# Patient Record
Sex: Male | Born: 2000
Health system: Southern US, Community
[De-identification: ages and names within clinical notes are randomized; demographics above are authoritative.]

## PROBLEM LIST (undated history)

## (undated) DIAGNOSIS — R51 Headache: Secondary | ICD-10-CM

## (undated) DIAGNOSIS — R519 Headache, unspecified: Secondary | ICD-10-CM

## (undated) DIAGNOSIS — K219 Gastro-esophageal reflux disease without esophagitis: Secondary | ICD-10-CM

## (undated) DIAGNOSIS — R569 Unspecified convulsions: Secondary | ICD-10-CM

## (undated) DIAGNOSIS — F419 Anxiety disorder, unspecified: Secondary | ICD-10-CM

## (undated) DIAGNOSIS — Z21 Asymptomatic human immunodeficiency virus [HIV] infection status: Secondary | ICD-10-CM

## (undated) DIAGNOSIS — R55 Syncope and collapse: Secondary | ICD-10-CM

## (undated) DIAGNOSIS — B20 Human immunodeficiency virus [HIV] disease: Secondary | ICD-10-CM

## (undated) HISTORY — PX: TONSILLECTOMY: SUR1361

## (undated) HISTORY — DX: Headache: R51

## (undated) HISTORY — DX: Headache, unspecified: R51.9

## (undated) HISTORY — DX: Syncope and collapse: R55

## (undated) HISTORY — DX: Gastro-esophageal reflux disease without esophagitis: K21.9

## (undated) HISTORY — DX: Unspecified convulsions: R56.9

---
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
## 2020-01-22 ENCOUNTER — Emergency Department (HOSPITAL_BASED_OUTPATIENT_CLINIC_OR_DEPARTMENT_OTHER)
Admission: EM | Admit: 2020-01-22 | Discharge: 2020-01-22 | Disposition: A | Discharge status: Home / Self Care | Payer: BLUE CROSS/BLUE SHIELD | Attending: Emergency Medicine | Admitting: Emergency Medicine

## 2020-01-22 ENCOUNTER — Other Ambulatory Visit: Payer: Self-pay

## 2020-01-22 ENCOUNTER — Encounter (HOSPITAL_BASED_OUTPATIENT_CLINIC_OR_DEPARTMENT_OTHER): Payer: Self-pay | Admitting: Emergency Medicine

## 2020-01-22 DIAGNOSIS — R0602 Shortness of breath: Secondary | ICD-10-CM | POA: Diagnosis not present

## 2020-01-22 DIAGNOSIS — Z5321 Procedure and treatment not carried out due to patient leaving prior to being seen by health care provider: Secondary | ICD-10-CM | POA: Insufficient documentation

## 2020-01-22 DIAGNOSIS — R05 Cough: Secondary | ICD-10-CM | POA: Insufficient documentation

## 2020-01-22 NOTE — ED Triage Notes (Signed)
Pt reports shortness of breath, loss of taste/smell and cough x3-4 days. States sick contacts at work last week.

## 2020-01-22 NOTE — ED Notes (Signed)
Pt did not answer when name called by 3

## 2020-01-23 DIAGNOSIS — B20 Human immunodeficiency virus [HIV] disease: Secondary | ICD-10-CM | POA: Insufficient documentation

## 2020-11-13 ENCOUNTER — Emergency Department (HOSPITAL_COMMUNITY)
Admission: EM | Admit: 2020-11-13 | Discharge: 2020-11-13 | Disposition: A | Discharge status: Home / Self Care | Payer: BLUE CROSS/BLUE SHIELD | Attending: Emergency Medicine | Admitting: Emergency Medicine

## 2020-11-13 ENCOUNTER — Encounter (HOSPITAL_COMMUNITY): Payer: Self-pay | Admitting: Emergency Medicine

## 2020-11-13 DIAGNOSIS — S60812A Abrasion of left wrist, initial encounter: Secondary | ICD-10-CM | POA: Diagnosis not present

## 2020-11-13 DIAGNOSIS — Z5321 Procedure and treatment not carried out due to patient leaving prior to being seen by health care provider: Secondary | ICD-10-CM | POA: Insufficient documentation

## 2020-11-13 DIAGNOSIS — Y92410 Unspecified street and highway as the place of occurrence of the external cause: Secondary | ICD-10-CM | POA: Insufficient documentation

## 2020-11-13 DIAGNOSIS — S80812A Abrasion, left lower leg, initial encounter: Secondary | ICD-10-CM | POA: Insufficient documentation
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
## 2020-11-14 ENCOUNTER — Encounter (HOSPITAL_BASED_OUTPATIENT_CLINIC_OR_DEPARTMENT_OTHER): Payer: Self-pay

## 2020-11-14 ENCOUNTER — Other Ambulatory Visit: Payer: Self-pay

## 2020-11-14 ENCOUNTER — Emergency Department (HOSPITAL_BASED_OUTPATIENT_CLINIC_OR_DEPARTMENT_OTHER): Payer: BLUE CROSS/BLUE SHIELD

## 2020-11-14 ENCOUNTER — Other Ambulatory Visit (HOSPITAL_COMMUNITY): Payer: Self-pay | Admitting: Emergency Medicine

## 2020-11-14 ENCOUNTER — Emergency Department (HOSPITAL_BASED_OUTPATIENT_CLINIC_OR_DEPARTMENT_OTHER)
Admission: EM | Admit: 2020-11-14 | Discharge: 2020-11-14 | Disposition: A | Discharge status: Home / Self Care | Payer: BLUE CROSS/BLUE SHIELD | Attending: Emergency Medicine | Admitting: Emergency Medicine

## 2020-11-14 DIAGNOSIS — S6992XA Unspecified injury of left wrist, hand and finger(s), initial encounter: Secondary | ICD-10-CM

## 2020-11-14 DIAGNOSIS — S80812A Abrasion, left lower leg, initial encounter: Secondary | ICD-10-CM | POA: Insufficient documentation

## 2020-11-14 DIAGNOSIS — Z21 Asymptomatic human immunodeficiency virus [HIV] infection status: Secondary | ICD-10-CM | POA: Diagnosis not present

## 2020-11-14 DIAGNOSIS — F17200 Nicotine dependence, unspecified, uncomplicated: Secondary | ICD-10-CM | POA: Diagnosis not present

## 2020-11-14 DIAGNOSIS — M545 Low back pain, unspecified: Secondary | ICD-10-CM | POA: Insufficient documentation

## 2020-11-14 DIAGNOSIS — R208 Other disturbances of skin sensation: Secondary | ICD-10-CM | POA: Insufficient documentation

## 2020-11-14 DIAGNOSIS — S60812A Abrasion of left wrist, initial encounter: Secondary | ICD-10-CM | POA: Diagnosis not present

## 2020-11-14 DIAGNOSIS — Y92410 Unspecified street and highway as the place of occurrence of the external cause: Secondary | ICD-10-CM | POA: Diagnosis not present

## 2020-11-14 DIAGNOSIS — Z79899 Other long term (current) drug therapy: Secondary | ICD-10-CM | POA: Insufficient documentation

## 2020-11-14 DIAGNOSIS — S8992XA Unspecified injury of left lower leg, initial encounter: Secondary | ICD-10-CM

## 2020-11-14 HISTORY — DX: Human immunodeficiency virus (HIV) disease: B20

## 2020-11-14 HISTORY — DX: Asymptomatic human immunodeficiency virus (hiv) infection status: Z21

## 2020-11-14 MED ORDER — CYCLOBENZAPRINE HCL 10 MG PO TABS: 10.0000 mg | ORAL_TABLET | Freq: Two times a day (BID) | ORAL | 0 refills | Status: DC | PRN

## 2020-11-14 MED ORDER — IBUPROFEN 400 MG PO TABS
600.0000 mg | ORAL_TABLET | Freq: Once | ORAL | Status: AC
  Administered 2020-11-14: 600 mg via ORAL
  Filled 2020-11-14: qty 1

## 2020-11-14 MED FILL — CYCLOBENZAPRINE HCL 10 MG T: 10 | 7 days supply | Qty: 14 | Fill #0
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
## 2020-11-14 NOTE — Discharge Instructions (Addendum)
Please read instructions below. Apply ice to your areas of pain for 20 minutes at a time. You can take tylenol every 6 hours as needed for pain. You can take flexeril every 12 hours as needed for muscle spasm. Be aware this medication can make you drowsy. Schedule an appointment with the sports medicine specialist if pain persists for repeat xray of your wrist in 1-2 weeks.  Return to the ER for severely worsening headache, vision changes, if new numbness or weakness in your arms or legs, inability to urinate, inability to hold your bowels, or concerning symptoms.

## 2021-03-05 ENCOUNTER — Emergency Department (HOSPITAL_BASED_OUTPATIENT_CLINIC_OR_DEPARTMENT_OTHER)
Admission: EM | Admit: 2021-03-05 | Discharge: 2021-03-06 | Disposition: A | Discharge status: Home / Self Care | Payer: BLUE CROSS/BLUE SHIELD | Attending: Emergency Medicine | Admitting: Emergency Medicine

## 2021-03-05 ENCOUNTER — Encounter (HOSPITAL_BASED_OUTPATIENT_CLINIC_OR_DEPARTMENT_OTHER): Payer: Self-pay | Admitting: *Deleted

## 2021-03-05 ENCOUNTER — Other Ambulatory Visit: Payer: Self-pay

## 2021-03-05 DIAGNOSIS — Z202 Contact with and (suspected) exposure to infections with a predominantly sexual mode of transmission: Secondary | ICD-10-CM | POA: Insufficient documentation

## 2021-03-05 DIAGNOSIS — T7421XA Adult sexual abuse, confirmed, initial encounter: Secondary | ICD-10-CM | POA: Insufficient documentation

## 2021-03-05 DIAGNOSIS — Z87891 Personal history of nicotine dependence: Secondary | ICD-10-CM | POA: Insufficient documentation
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
## 2021-03-06 DIAGNOSIS — T7421XA Adult sexual abuse, confirmed, initial encounter: Secondary | ICD-10-CM | POA: Diagnosis not present

## 2021-03-06 MED ORDER — LIDOCAINE HCL (PF) 1 % IJ SOLN
INTRAMUSCULAR | Status: AC
  Filled 2021-03-06: qty 5

## 2021-03-06 MED ORDER — DOXYCYCLINE HYCLATE 100 MG PO TABS
100.0000 mg | ORAL_TABLET | Freq: Once | ORAL | Status: AC
  Administered 2021-03-06: 100 mg via ORAL
  Filled 2021-03-06: qty 1

## 2021-03-06 MED ORDER — DOXYCYCLINE HYCLATE 100 MG PO CAPS: 100.0000 mg | ORAL_CAPSULE | Freq: Two times a day (BID) | ORAL | 0 refills | Status: DC

## 2021-03-06 MED ORDER — CEFTRIAXONE SODIUM 500 MG IJ SOLR
500.0000 mg | Freq: Once | INTRAMUSCULAR | Status: AC
  Administered 2021-03-06: 500 mg via INTRAMUSCULAR
  Filled 2021-03-06: qty 500

## 2021-03-06 NOTE — ED Notes (Signed)
Chaperone with MD for rectal exam and GC collection , Pt refused/ decided against Sane exam , v/u his options and  d/c instructions. Pt chose for TX of STD instead of waiting for results.
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
## 2021-03-07 LAB — GC/CHLAMYDIA PROBE AMP (~~LOC~~) NOT AT ARMC
Chlamydia: NEGATIVE
Comment: NEGATIVE
Comment: NORMAL
Neisseria Gonorrhea: POSITIVE — AB
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
## 2021-10-01 ENCOUNTER — Encounter (HOSPITAL_BASED_OUTPATIENT_CLINIC_OR_DEPARTMENT_OTHER): Payer: Self-pay | Admitting: Emergency Medicine

## 2021-10-01 ENCOUNTER — Emergency Department (HOSPITAL_BASED_OUTPATIENT_CLINIC_OR_DEPARTMENT_OTHER)
Admission: EM | Admit: 2021-10-01 | Discharge: 2021-10-01 | Disposition: A | Discharge status: Home / Self Care | Payer: BLUE CROSS/BLUE SHIELD | Attending: Emergency Medicine | Admitting: Emergency Medicine

## 2021-10-01 ENCOUNTER — Other Ambulatory Visit: Payer: Self-pay

## 2021-10-01 DIAGNOSIS — K6289 Other specified diseases of anus and rectum: Secondary | ICD-10-CM | POA: Diagnosis present

## 2021-10-01 MED ORDER — LIDOCAINE 5 % EX OINT: 1.0000 "application " | TOPICAL_OINTMENT | Freq: Four times a day (QID) | CUTANEOUS | 0 refills | Status: DC | PRN

## 2021-10-01 MED ORDER — LIDOCAINE HCL URETHRAL/MUCOSAL 2 % EX GEL
1.0000 "application " | Freq: Once | CUTANEOUS | Status: AC
  Administered 2021-10-01: 1 via TOPICAL

## 2021-10-01 MED ORDER — AMOXICILLIN-POT CLAVULANATE 875-125 MG PO TABS: 1.0000 | ORAL_TABLET | Freq: Two times a day (BID) | ORAL | 0 refills | Status: DC

## 2021-10-01 NOTE — ED Notes (Signed)
Discharge instructions discussed with pt. Pt verbalized understanding. Pt stable and ambulatory.  °

## 2021-10-01 NOTE — ED Triage Notes (Signed)
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 Now has "Knot" on anus attempted home meds and remedies with no relief.

## 2021-10-01 NOTE — ED Provider Notes (Signed)
[facility] EMERGENCY DEPARTMENT Provider Note   CSN: [id number] Arrival date & time: 10/01/21  0229     History  Chief Complaint  Patient presents with   Rectal Pain    Steven [name] is a 21 y.o. male.  The history is provided by the patient.  Steven [name] is a 21 y.o. male who presents to the Emergency Department complaining of rectal pain.  He presents emergency department complaining of rectal pain that started about 2 days ago.  He states that he was moving some heavy boxes for work when he started to develop some mild rectal discomfort.  Symptoms worsened yesterday when he had a bowel movement.  He describes pain just after he has a bowel movement.  No rectal discharge.  No constipation.  No fevers but he does feel hot today.  No nausea, vomiting.  No prior similar symptoms.  He feels like he is swollen in that area.  He does have a history of HIV that is well controlled on medications.  He is compliant with his home medications.  He is not receiving rectal intercourse.  No prior similar symptoms.    Home Medications Prior to Admission medications   Medication Sig Start Date End Date Taking? Authorizing Provider  amoxicillin-clavulanate (AUGMENTIN) 875-125 MG tablet Take 1 tablet by mouth every 12 (twelve) hours. 10/01/21  Yes Tilden Fossa, MD  lidocaine (XYLOCAINE) 5 % ointment Apply 1 application topically 4 (four) times daily as needed. 10/01/21  Yes Tilden Fossa, MD  bictegravir-emtricitabine-tenofovir AF (BIKTARVY) 50-200-25 MG TABS tablet Take 1 tablet by mouth daily. 09/09/20   [provider]  doxycycline (VIBRAMYCIN) 100 MG capsule Take 1 capsule (100 mg total) by mouth 2 (two) times daily. One po bid x 7 days 03/06/21   Molpus, Jonny Ruiz, MD      Allergies    Patient has no known allergies.    Review of Systems   Review of Systems  All other systems reviewed and are negative.  Physical Exam Updated Vital Signs BP 107/63 (BP Location: Left Arm)     Pulse 98    Resp (!) 22    Ht 5\' 11"  (1.803 m)    Wt 99.8 kg    SpO2 100%    BMI 30.68 kg/m  Physical Exam Vitals and nursing note reviewed.  Constitutional:      Appearance: He is well-developed.  HENT:     Head: Normocephalic and atraumatic.  Cardiovascular:     Rate and Rhythm: Normal rate and regular rhythm.  Pulmonary:     Effort: Pulmonary effort is normal. No respiratory distress.  Abdominal:     Palpations: Abdomen is soft.     Tenderness: There is no abdominal tenderness. There is no guarding or rebound.  Genitourinary:    Comments: There is a very mild swelling at the 12 o'clock position with local tenderness to palpation.  There is no significant induration or fluctuance.  There is tenderness on DRE without any palpable masses.  No gross blood on DRE. Musculoskeletal:        General: No tenderness.  Skin:    General: Skin is warm and dry.  Neurological:     Mental Status: He is alert and oriented to person, place, and time.  Psychiatric:        Behavior: Behavior normal.    ED Results / Procedures / Treatments   Labs (all labs ordered are listed, but only abnormal results are displayed) Labs Reviewed - No data  to display  EKG None  Radiology No results found.  Procedures Procedures    Medications Ordered in ED Medications  lidocaine (XYLOCAINE) 2 % jelly 1 application (has no administration in time range)    ED Course/ Medical Decision Making/ A&P                           Medical Decision Making  Patient here for evaluation of rectal pain.  He is nontoxic-appearing on evaluation.  External digital rectal examination without clear hemorrhoid present.  No definite abscess.  Will treat with antibiotics for possible very early infection.  Will treat with Xylocaine for pain control.  Discussed with patient home care for rectal discomfort as well as return precautions.  Current presentation is not consistent with STI/proctitis as patient is not currently  sexually active.        Final Clinical Impression(s) / ED Diagnoses Final diagnoses:  Rectal pain    Rx / DC Orders ED Discharge Orders          Ordered    amoxicillin-clavulanate (AUGMENTIN) 875-125 MG tablet  Every 12 hours        10/01/21 0451    lidocaine (XYLOCAINE) 5 % ointment  4 times daily PRN        10/01/21 0451              Tilden Fossa, MD 10/01/21 [id number]

## 2021-10-27 ENCOUNTER — Other Ambulatory Visit: Payer: Self-pay

## 2021-12-10 ENCOUNTER — Emergency Department (HOSPITAL_BASED_OUTPATIENT_CLINIC_OR_DEPARTMENT_OTHER)
Admission: EM | Admit: 2021-12-10 | Discharge: 2021-12-10 | Disposition: A | Discharge status: Home / Self Care | Payer: BLUE CROSS/BLUE SHIELD | Attending: Emergency Medicine | Admitting: Emergency Medicine

## 2021-12-10 ENCOUNTER — Other Ambulatory Visit: Payer: Self-pay

## 2021-12-10 ENCOUNTER — Encounter (HOSPITAL_BASED_OUTPATIENT_CLINIC_OR_DEPARTMENT_OTHER): Payer: Self-pay | Admitting: Emergency Medicine

## 2021-12-10 DIAGNOSIS — Z202 Contact with and (suspected) exposure to infections with a predominantly sexual mode of transmission: Secondary | ICD-10-CM | POA: Diagnosis not present

## 2021-12-10 DIAGNOSIS — N4889 Other specified disorders of penis: Secondary | ICD-10-CM | POA: Diagnosis present

## 2021-12-10 MED ORDER — CEFTRIAXONE SODIUM 500 MG IJ SOLR
500.0000 mg | Freq: Once | INTRAMUSCULAR | Status: AC
  Administered 2021-12-10: 500 mg via INTRAMUSCULAR
  Filled 2021-12-10: qty 500

## 2021-12-10 MED ORDER — PENICILLIN G BENZATHINE 1200000 UNIT/2ML IM SUSY
2.4000 10*6.[IU] | PREFILLED_SYRINGE | Freq: Once | INTRAMUSCULAR | Status: AC
  Administered 2021-12-10: 2.4 10*6.[IU] via INTRAMUSCULAR
  Filled 2021-12-10: qty 4

## 2021-12-10 MED ORDER — DOXYCYCLINE HYCLATE 100 MG PO CAPS: 100.0000 mg | ORAL_CAPSULE | Freq: Two times a day (BID) | ORAL | 0 refills | Status: AC

## 2021-12-10 NOTE — ED Triage Notes (Signed)
Reports unprotected sexual intercourse 3 days ago . Noticed a "spot " on his penis last night  ?Denies urinary symptoms nor penile discharge  ?

## 2021-12-10 NOTE — Discharge Instructions (Addendum)
It was a pleasure taking care of you today! ? ?You have pending lab results for STI work-up.  You may see the results of your labs on MyChart.  You were treated today for gonorrhea and chlamydia due to your symptoms and exposure.  You were also treated today for syphilis.  Ensure to complete the entire course of antibiotics (doxycycline) prescribed. You may follow up with your primary care provider or Health Department if you are experiencing continued symptoms.  If you have future STI concerns you may follow-up with the regional [facility] [phone]).  This location provides testing and treatment for STI and prep services.  This is free for individuals without insurance.  Appointments are required and typically you can get in on the same day.  Ensure to practice safe sex with condom use.  Ensure to maintain follow-up with your infectious disease specialist.  Return to the Emergency Department if you are experiencing fever, increasing/worsening abdominal pain, or penile pain/swelling. ?

## 2021-12-10 NOTE — ED Provider Notes (Signed)
?[facility] EMERGENCY DEPARTMENT ?Provider Note ? ? ?CSN: [id number] ?Arrival date & time: 12/10/21  1050 ? ?  ? ?History ? ?Chief Complaint  ?Patient presents with  ? SEXUALLY TRANSMITTED DISEASE  ? ? ?Steven [name] is a 21 y.o. male with a past medical history of HIV who presents to the emergency department with concerns for exposure to STD onset 3 days ago.  He notes that he had unprotected sexual intercourse approximately 3 days ago.  He noticed a area to his penis onset last night.  Denies fever, chills, abdominal pain, nausea, vomiting, dysuria, hematuria, penile pain/swelling, testicular pain/swelling. Patient has a history of HIV and his viral load is undetected.   ? ?The history is provided by the patient. No language interpreter was used.  ? ?  ? ?Home Medications ?Prior to Admission medications   ?Medication Sig Start Date End Date Taking? Authorizing Provider  ?doxycycline (VIBRAMYCIN) 100 MG capsule Take 1 capsule (100 mg total) by mouth 2 (two) times daily for 7 days. 12/10/21 12/17/21 Yes Agnieszka Newhouse A, PA-C  ?amoxicillin-clavulanate (AUGMENTIN) 875-125 MG tablet Take 1 tablet by mouth every 12 (twelve) hours. 10/01/21   Tilden Fossa, MD  ?bictegravir-emtricitabine-tenofovir AF (BIKTARVY) 50-200-25 MG TABS tablet Take 1 tablet by mouth daily. 09/09/20   [provider]  ?lidocaine (XYLOCAINE) 5 % ointment Apply 1 application topically 4 (four) times daily as needed. 10/01/21   Tilden Fossa, MD  ?   ? ?Allergies    ?Patient has no known allergies.   ? ?Review of Systems   ?Review of Systems  ?Constitutional:  Negative for chills and fever.  ?Gastrointestinal:  Negative for abdominal pain, nausea and vomiting.  ?Genitourinary:  Positive for genital sores. Negative for dysuria, hematuria, penile discharge, penile pain, penile swelling, scrotal swelling and testicular pain.  ?Skin:  Negative for rash.  ?All other systems reviewed and are negative. ? ?Physical Exam ?Updated Vital  Signs ?BP 127/78 (BP Location: Right Arm)   Pulse 60   Temp 98.4 ?F (36.9 ?C) (Oral)   Resp 18   Ht 5\' 11"  (1.803 m)   Wt 93.9 kg   SpO2 99%   BMI 28.87 kg/m?  ?Physical Exam ?Vitals and nursing note reviewed. Exam conducted with a chaperone present.  ?Constitutional:   ?   General: He is not in acute distress. ?   Appearance: Normal appearance. He is not ill-appearing.  ?HENT:  ?   Head: Normocephalic and atraumatic.  ?   Right Ear: External ear normal.  ?   Left Ear: External ear normal.  ?Eyes:  ?   General: No scleral icterus. ?Cardiovascular:  ?   Rate and Rhythm: Normal rate and regular rhythm.  ?   Pulses: Normal pulses.  ?   Heart sounds: Normal heart sounds.  ?Pulmonary:  ?   Effort: Pulmonary effort is normal.  ?   Breath sounds: Normal breath sounds.  ?Abdominal:  ?   General: Abdomen is flat. Bowel sounds are normal. There is no distension.  ?   Palpations: Abdomen is soft. There is no mass.  ?   Tenderness: There is no abdominal tenderness.  ?   Hernia: There is no hernia in the left inguinal area or right inguinal area.  ?Genitourinary: ?   Penis: Lesions present.   ?   Testes: Normal. Cremasteric reflex is present.     ?   Right: Mass, tenderness or swelling not present. Right testis is descended.     ?  Left: Mass, tenderness or swelling not present. Left testis is descended.  ?   Epididymis:  ?   Right: Normal.  ?   Left: Normal.  ?   Comments: NT chaperone present for exam.  Small area of nontender ulceration noted to the lateral aspect of the penis. ?Musculoskeletal:     ?   General: Normal range of motion.  ?   Cervical back: Normal range of motion and neck supple.  ?Lymphadenopathy:  ?   Lower Body: No right inguinal adenopathy. No left inguinal adenopathy.  ?Skin: ?   General: Skin is warm and dry.  ?   Findings: No rash.  ?Neurological:  ?   Mental Status: He is alert.  ? ? ?ED Results / Procedures / Treatments   ?Labs ?(all labs ordered are listed, but only abnormal results are  displayed) ?Labs Reviewed  ?RPR  ?GC/CHLAMYDIA PROBE AMP ([place]  ? ? ?EKG ?None ? ?Radiology ?No results found. ? ?Procedures ?Procedures  ? ? ?Medications Ordered in ED ?Medications  ?penicillin g benzathine (BICILLIN LA) 1200000 UNIT/2ML injection 2.4 Million Units (2.4 Million Units Intramuscular Given 12/10/21 1258)  ?cefTRIAXone (ROCEPHIN) injection 500 mg (500 mg Intramuscular Given 12/10/21 1259)  ? ? ?ED Course/ Medical Decision Making/ A&P ?Clinical Course as of 12/10/21 1859  ?Wed Dec 10, 2021  ?1249 Discussed with patient at bedside regarding STD prophylaxis treatment in the ED.  Patient agreeable to being treated for gonorrhea, chlamydia, syphilis at this time.  Discussed discharge treatment plan with patient at bedside.  Patient agreeable at this time.  Patient appears safe for discharge. [SB]  ?  ?Clinical Course User Index ?[SB] Lalah Durango A, PA-C  ? ?                        ?Medical Decision Making ?Amount and/or Complexity of Data Reviewed ?Labs: ordered. ? ?Risk ?Prescription drug management. ? ? ?Patient presents to the ED with concerns for possible STD due to unprotected sexual intercourse 3 days ago.  Patient also noted a spot to his penis onset last night.  No urinary symptoms or penile discharge at this time.  Vital signs stable, patient afebrile.  On exam patient without acute cardiovascular, respiratory, abdominal pain findings.  Nurse tech chaperone present for genital exam, small nontender ulceration noted lateral aspect of penis without discharge.  No testicular tenderness to palpation or swelling on exam.  Differential diagnosis includes gonorrhea, chlamydia, syphilis, epididymitis.  ? ?Labs:  ?I ordered, and personally interpreted labs.  The pertinent results include:   ?GC/chlamydia and syphilis ordered and results pending at time of discharge. ? ?Medications:  ?I ordered medication including Rocephin and penicillin for STD prophylaxis ?Reevaluation of the patient  after these medicines and interventions, I reevaluated the patient and found that they have improved ?I have reviewed the patients home medicines and have made adjustments as needed ? ? ?Disposition: ?Patient presentation suspicious for possible exposure to STD.  Due to nontender ulceration on penis, will prophylactically treat patient for syphilis here in the ED.  Patient also treated prophylactically for gonorrhea and chlamydia in the ED.  Results ordered and pending at time of discharge.  After consideration of the diagnostic results and the patients response to treatment, I feel that the patient would benefit from Discharge home.  We will send a prescription for doxycycline due to symptoms and exposure.  Patient advised to inform and treat all sexual partners.  Pt advised on safe sex practices and understands that they have GC/Chlamydia cultures pending and will result in 2-3 days. Pt encouraged to follow up at local health department for future STI checks. Supportive care measures and strict return precautions discussed with patient at bedside. Pt acknowledges and verbalizes understanding. Pt appears safe for discharge. Follow up as indicated in discharge paperwork.  ? ? ?This chart was dictated using voice recognition software, Dragon. Despite the best efforts of this provider to proofread and correct errors, errors may still occur which can change documentation meaning. ? ? ?Final Clinical Impression(s) / ED Diagnoses ?Final diagnoses:  ?Possible exposure to STD  ? ? ?Rx / DC Orders ?ED Discharge Orders   ? ?      Ordered  ?  doxycycline (VIBRAMYCIN) 100 MG capsule  2 times daily       ? 12/10/21 1249  ? ?  ?  ? ?  ? ? ?  ?Breyon Sigg A, PA-C ?12/10/21 1859 ? ?  ?Alvira Monday, MD ?12/11/21 [phone] ? ?

## 2021-12-11 LAB — GC/CHLAMYDIA PROBE AMP (~~LOC~~) NOT AT ARMC
Chlamydia: NEGATIVE
Comment: NEGATIVE
Comment: NORMAL
Neisseria Gonorrhea: NEGATIVE

## 2021-12-11 LAB — RPR
RPR Ser Ql: REACTIVE — AB
RPR Titer: 1:1 {titer}

## 2021-12-12 LAB — T.PALLIDUM AB, TOTAL: T Pallidum Abs: REACTIVE — AB

## 2022-05-28 ENCOUNTER — Encounter: Payer: Self-pay | Admitting: Emergency Medicine

## 2022-05-28 ENCOUNTER — Ambulatory Visit
Admission: EM | Admit: 2022-05-28 | Discharge: 2022-05-28 | Disposition: A | Discharge status: Home / Self Care | Payer: BLUE CROSS/BLUE SHIELD | Attending: Physician Assistant | Admitting: Physician Assistant

## 2022-05-28 DIAGNOSIS — Z113 Encounter for screening for infections with a predominantly sexual mode of transmission: Secondary | ICD-10-CM | POA: Insufficient documentation

## 2022-05-28 DIAGNOSIS — K644 Residual hemorrhoidal skin tags: Secondary | ICD-10-CM | POA: Diagnosis present

## 2022-05-28 DIAGNOSIS — K6289 Other specified diseases of anus and rectum: Secondary | ICD-10-CM | POA: Diagnosis present

## 2022-05-28 MED ORDER — HYDROCORTISONE ACETATE 25 MG RE SUPP: 25.0000 mg | Freq: Two times a day (BID) | RECTAL | 0 refills | Status: DC

## 2022-05-28 MED ORDER — HYDROCORTISONE (PERIANAL) 2.5 % EX CREA: 1.0000 | TOPICAL_CREAM | Freq: Two times a day (BID) | CUTANEOUS | 0 refills | Status: DC

## 2022-05-28 NOTE — ED Provider Notes (Signed)
[facility]    CSN: [id number] Arrival date & time: 05/28/22  1535      History   Chief Complaint Chief Complaint  Patient presents with   Hemorrhoids    HPI Yao Brossman is a 21 y.o. male.   Patient presents today with a 2-day history of rectal pain.  He reports episodes of similar symptoms in the past that have been diagnosed as hemorrhoids.  Reports that the symptoms resolved until recurrence today.  Denies any changes in bowel habits including constipation.  Reports he is able to pass bowel movements without difficulty.  Pain is rated 6 on a 0-10 pain scale, localized to 6 o'clock position of rectum, described as sharp, worse with prolonged sitting or palpation, no alleviating factors identified.  Denies any melena, hematochezia, fever, nausea, vomiting.  He does participate in anal receptive intercourse and is open to STI testing in this area.  Denies any recent antibiotic use.  He does have a history of HIV but reports that his CD4 counts are normal and his viral load is undetectable.  He does take Biktarvy as prescribed and has not missed any doses.    Past Medical History:  Diagnosis Date   Anxiety    Generalized headaches    GERD (gastroesophageal reflux disease)    HIV (human immunodeficiency virus infection) ([place])    Seizures ([place])    last seizure 2 weeks ago   Syncope     Patient Active Problem List   Diagnosis Date Noted   Seizures ([place]) 08/03/2018   Major depressive disorder, single episode, severe without psychotic features ([place])    Generalized anxiety disorder    Suicidal ideation    Parent-child relationship problem    Unspecified convulsions ([place]) 01/31/2013   Seizure disorder ([place]) 01/30/2013   Headache(784.0) 01/30/2013    Past Surgical History:  Procedure Laterality Date   CIRCUMCISION  06/02/2000       Home Medications    Prior to Admission medications   Medication Sig Start Date End Date Taking? Authorizing Provider   hydrocortisone (ANUSOL-HC) 2.5 % rectal cream Place 1 Application rectally 2 (two) times daily. 05/28/22  Yes Reise Hietala, Derry Skill, PA-C  hydrocortisone (ANUSOL-HC) 25 MG suppository Place 1 suppository (25 mg total) rectally 2 (two) times daily. 05/28/22  Yes Sakira Dahmer K, PA-C  amoxicillin-clavulanate (AUGMENTIN) 875-125 MG tablet Take 1 tablet by mouth every 12 (twelve) hours. 10/01/21   Quintella Reichert, MD  bictegravir-emtricitabine-tenofovir AF (BIKTARVY) 50-200-25 MG TABS tablet Take 1 tablet by mouth daily. 09/09/20   [provider]  lidocaine (XYLOCAINE) 5 % ointment Apply 1 application topically 4 (four) times daily as needed. 10/01/21   Quintella Reichert, MD    Family History Family History  Problem Relation Age of Onset   Epilepsy Maternal Grandmother    Epilepsy Paternal Grandmother    Seizures Maternal Aunt    Seizures Cousin    Depression Sister     Social History Social History   Tobacco Use   Smoking status: Former   Smokeless tobacco: Never  Scientific laboratory technician Use: Never used  Substance Use Topics   Alcohol use: Yes   Drug use: Yes    Frequency: 1.0 times per week    Types: Marijuana     Allergies   Patient has no known allergies.   Review of Systems Review of Systems  Constitutional:  Negative for activity change, appetite change, fatigue and fever.  Gastrointestinal:  Positive for rectal pain. Negative  for abdominal pain, anal bleeding, blood in stool, constipation, diarrhea, nausea and vomiting.  Neurological:  Negative for dizziness, light-headedness and headaches.     Physical Exam Triage Vital Signs ED Triage Vitals  Enc Vitals Group     BP 05/28/22 1553 134/77     Pulse Rate 05/28/22 1553 83     Resp 05/28/22 1553 18     Temp 05/28/22 1553 97.9 F (36.6 C)     Temp src --      SpO2 05/28/22 1553 97 %     Weight --      Height --      Head Circumference --      Peak Flow --      Pain Score 05/28/22 1552 6     Pain Loc --      Pain  Edu? --      Excl. in GC? --    No data found.  Updated Vital Signs BP 134/77   Pulse 83   Temp 97.9 F (36.6 C)   Resp 18   SpO2 97%   Visual Acuity Right Eye Distance:   Left Eye Distance:   Bilateral Distance:    Right Eye Near:   Left Eye Near:    Bilateral Near:     Physical Exam Vitals reviewed.  Constitutional:      General: He is awake.     Appearance: Normal appearance. He is well-developed. He is not ill-appearing.     Comments: Very pleasant male appears stated age in no acute distress sitting comfortably in exam room  HENT:     Head: Normocephalic and atraumatic.     Mouth/Throat:     Pharynx: No oropharyngeal exudate, posterior oropharyngeal erythema or uvula swelling.  Cardiovascular:     Rate and Rhythm: Normal rate and regular rhythm.     Heart sounds: Normal heart sounds, S1 normal and S2 normal. No murmur heard. Pulmonary:     Effort: Pulmonary effort is normal.     Breath sounds: Normal breath sounds. No stridor. No wheezing, rhonchi or rales.     Comments: Clear to auscultation bilaterally Abdominal:     General: Bowel sounds are normal.     Palpations: Abdomen is soft.     Tenderness: There is no abdominal tenderness.  Genitourinary:    Rectum: Tenderness and external hemorrhoid present. No anal fissure or internal hemorrhoid. Normal anal tone.     Comments: Small tender to palpation external hemorrhoid noted without evidence of thrombosis noted at 6 o'clock position.  Torrie CMA present as chaperone Neurological:     Mental Status: He is alert.  Psychiatric:        Behavior: Behavior is cooperative.      UC Treatments / Results  Labs (all labs ordered are listed, but only abnormal results are displayed) Labs Reviewed  CYTOLOGY, (ORAL, ANAL, URETHRAL) ANCILLARY ONLY  CYTOLOGY, (ORAL, ANAL, URETHRAL) ANCILLARY ONLY    EKG   Radiology No results found.  Procedures Procedures (including critical care time)  Medications Ordered  in UC Medications - No data to display  Initial Impression / Assessment and Plan / UC Course  I have reviewed the triage vital signs and the nursing notes.  Pertinent labs & imaging results that were available during my care of the patient were reviewed by me and considered in my medical decision making (see chart for details).     Suspect hemorrhoid as etiology of symptoms given clinical presentation.  Patient was started  on Anusol suppositories as well as cream for symptom relief.  Recommended that he avoid constipation by increasing fiber and water in his diet.  He is to use conservative treatment measures for additional symptom relief.  STI swab of weakness collected.  Patient self collected a penile swab as well.  We will contact him if any of this is positive.  Discussed that if he has any worsening symptoms including increased pain, hematochezia, he needs to be seen immediately.  Strict return precautions given to which he expressed understanding.  Final Clinical Impressions(s) / UC Diagnoses   Final diagnoses:  External hemorrhoid  Rectal pain  Routine screening for STI (sexually transmitted infection)     Discharge Instructions      We will contact you if any of your testing is positive.  Please use sitz bath's to help manage your symptoms.  Avoid constipation by increasing fiber and fluid in your diet.  Use Anusol cream for pain relief.  Use Anusol suppository to up to twice a day as needed.  If your symptoms or not improving quickly or if anything worsens including severe pain, difficulty passing a bowel movement, fever, nausea, vomiting you need to be seen immediately     ED Prescriptions     Medication Sig Dispense Auth. Provider   hydrocortisone (ANUSOL-HC) 25 MG suppository Place 1 suppository (25 mg total) rectally 2 (two) times daily. 12 suppository Cherilyn Sautter K, PA-C   hydrocortisone (ANUSOL-HC) 2.5 % rectal cream Place 1 Application rectally 2 (two) times daily.  30 g Beyza Bellino, Noberto Retort, PA-C      PDMP not reviewed this encounter.   Jeani Hawking, PA-C 05/28/22 1633

## 2022-05-28 NOTE — ED Triage Notes (Addendum)
Pt is present today with c/o possible hemorrhoid  and anal pain. Pt sx staretd x2 days ago. Pt states that he hs tried OTC preparation H but no relief

## 2022-05-28 NOTE — Discharge Instructions (Addendum)
We will contact you if any of your testing is positive.  Please use sitz bath's to help manage your symptoms.  Avoid constipation by increasing fiber and fluid in your diet.  Use Anusol cream for pain relief.  Use Anusol suppository to up to twice a day as needed.  If your symptoms or not improving quickly or if anything worsens including severe pain, difficulty passing a bowel movement, fever, nausea, vomiting you need to be seen immediately

## 2022-05-29 ENCOUNTER — Telehealth (HOSPITAL_COMMUNITY): Payer: Self-pay | Admitting: Emergency Medicine

## 2022-05-29 LAB — CYTOLOGY, (ORAL, ANAL, URETHRAL) ANCILLARY ONLY
Chlamydia: NEGATIVE
Chlamydia: POSITIVE — AB
Comment: NEGATIVE
Comment: NEGATIVE
Comment: NEGATIVE
Comment: NEGATIVE
Comment: NORMAL
Comment: NORMAL
Neisseria Gonorrhea: POSITIVE — AB
Neisseria Gonorrhea: POSITIVE — AB
Trichomonas: NEGATIVE
Trichomonas: NEGATIVE

## 2022-05-29 MED ORDER — DOXYCYCLINE HYCLATE 100 MG PO CAPS: 100.0000 mg | ORAL_CAPSULE | Freq: Two times a day (BID) | ORAL | 0 refills | Status: AC

## 2022-05-29 NOTE — Telephone Encounter (Signed)
Per protocol, patient will need treatment with IM ROcephin 500mg  for positive Gonorrhea.  Will also need treatment with Doxycycline.  Contacted patient by phone.  Verified identity using two identifiers.  Provided positive result.  Reviewed safe sex practices, notifying partners, and refraining from sexual activities for 7 days from time of treatment.  Patient verified understanding, all questions answered.   HHS notified

## 2022-06-04 ENCOUNTER — Ambulatory Visit
Admission: RE | Admit: 2022-06-04 | Discharge: 2022-06-04 | Disposition: A | Discharge status: Home / Self Care | Payer: BLUE CROSS/BLUE SHIELD | Source: Ambulatory Visit | Attending: Physician Assistant | Admitting: Physician Assistant

## 2022-06-04 DIAGNOSIS — A549 Gonococcal infection, unspecified: Secondary | ICD-10-CM

## 2022-06-04 MED ORDER — CEFTRIAXONE SODIUM 250 MG IJ SOLR
500.0000 mg | Freq: Once | INTRAMUSCULAR | Status: AC
  Administered 2022-06-04: 500 mg via INTRAMUSCULAR

## 2022-06-04 NOTE — ED Triage Notes (Signed)
Pt presents to uc with need for sti treatment

## 2022-08-27 ENCOUNTER — Emergency Department (HOSPITAL_BASED_OUTPATIENT_CLINIC_OR_DEPARTMENT_OTHER)
Admission: EM | Admit: 2022-08-27 | Discharge: 2022-08-27 | Disposition: A | Discharge status: Home / Self Care | Payer: BLUE CROSS/BLUE SHIELD | Attending: Emergency Medicine | Admitting: Emergency Medicine

## 2022-08-27 ENCOUNTER — Encounter (HOSPITAL_BASED_OUTPATIENT_CLINIC_OR_DEPARTMENT_OTHER): Payer: Self-pay | Admitting: Emergency Medicine

## 2022-08-27 ENCOUNTER — Other Ambulatory Visit: Payer: Self-pay

## 2022-08-27 DIAGNOSIS — K6289 Other specified diseases of anus and rectum: Secondary | ICD-10-CM | POA: Diagnosis not present

## 2022-08-27 DIAGNOSIS — A64 Unspecified sexually transmitted disease: Secondary | ICD-10-CM | POA: Insufficient documentation

## 2022-08-27 DIAGNOSIS — Z202 Contact with and (suspected) exposure to infections with a predominantly sexual mode of transmission: Secondary | ICD-10-CM | POA: Diagnosis not present

## 2022-08-27 DIAGNOSIS — R198 Other specified symptoms and signs involving the digestive system and abdomen: Secondary | ICD-10-CM | POA: Insufficient documentation

## 2022-08-27 DIAGNOSIS — Z711 Person with feared health complaint in whom no diagnosis is made: Secondary | ICD-10-CM

## 2022-08-27 MED ORDER — DOXYCYCLINE HYCLATE 100 MG PO CAPS: 100.0000 mg | ORAL_CAPSULE | Freq: Two times a day (BID) | ORAL | 0 refills | Status: DC

## 2022-08-27 MED ORDER — DOXYCYCLINE HYCLATE 100 MG PO TABS
100.0000 mg | ORAL_TABLET | Freq: Once | ORAL | Status: AC
  Administered 2022-08-27: 100 mg via ORAL
  Filled 2022-08-27: qty 1

## 2022-08-27 MED ORDER — CEFTRIAXONE SODIUM 500 MG IJ SOLR
500.0000 mg | Freq: Once | INTRAMUSCULAR | Status: AC
  Administered 2022-08-27: 500 mg via INTRAMUSCULAR
  Filled 2022-08-27: qty 500

## 2022-08-27 NOTE — ED Triage Notes (Signed)
Patient reports he is concerned he has an STD. States his rectum has been itching and when he wipes he has noticed mucous-like (yellow) discharge.

## 2022-08-27 NOTE — Discharge Instructions (Signed)
Please read and follow all provided instructions.  Your diagnoses today include:  1. Rectal discharge   2. Concern about STD in male without diagnosis     Tests performed today include: Test for gonorrhea and chlamydia.  Vital signs. See below for your results today.   Medications:  For treatment of gonorrhea: You were treated with a rocephin (shot) today.   For treatment of chlamydia: Please take the doxycycline as directed.   Home care instructions:  Read educational materials contained in this packet and follow any instructions provided.   You should tell your partners about your infection and avoid having sex for one week to allow time for the medicine to work.  Sexually transmitted disease testing also available at:  [facility] [place], [facility] [address] [place], phone [phone] or [phone]   Monday - Friday, call for an appointment  Return instructions:  Please return to the Emergency Department if you experience worsening symptoms.  Please return if you have any other emergent concerns.  Additional Information:  Your vital signs today were: BP (!) 140/83 (BP Location: Left Arm)   Pulse 73   Temp 98.3 F (36.8 C) (Oral)   Resp 18   Ht 5\' 11"  (1.803 m)   Wt 111.1 kg   SpO2 94%   BMI 34.17 kg/m  If your blood pressure (BP) was elevated above 135/85 this visit, please have this repeated by your doctor within one month. --------------

## 2022-08-27 NOTE — ED Provider Notes (Signed)
[facility] EMERGENCY DEPARTMENT Provider Note   CSN: [id number] Arrival date & time: 08/27/22  2206     History  Chief Complaint  Patient presents with   Exposure to STD    Steven [name] is a 21 y.o. male.  Patient presents to the emergency department for 2 days of rectal discharge.  Patient is HIV positive.  He has had receptive anal intercourse.  He developed a yellow discharge and some itching 2 days ago.  He has had hemorrhoids and initially thought that is what it was related to.  Had minor burning with urination tonight and some left-sided abdominal pain.  No vomiting or diarrhea.  No fevers.  No pain with defecation.       Home Medications Prior to Admission medications   Medication Sig Start Date End Date Taking? Authorizing Provider  amoxicillin-clavulanate (AUGMENTIN) 875-125 MG tablet Take 1 tablet by mouth every 12 (twelve) hours. 10/01/21   Tilden Fossa, MD  bictegravir-emtricitabine-tenofovir AF (BIKTARVY) 50-200-25 MG TABS tablet Take 1 tablet by mouth daily. 09/09/20   [provider]  hydrocortisone (ANUSOL-HC) 2.5 % rectal cream Place 1 Application rectally 2 (two) times daily. 05/28/22   Raspet, Noberto Retort, PA-C  hydrocortisone (ANUSOL-HC) 25 MG suppository Place 1 suppository (25 mg total) rectally 2 (two) times daily. 05/28/22   Raspet, Denny Peon K, PA-C  lidocaine (XYLOCAINE) 5 % ointment Apply 1 application topically 4 (four) times daily as needed. 10/01/21   Tilden Fossa, MD      Allergies    Patient has no known allergies.    Review of Systems   Review of Systems  Physical Exam Updated Vital Signs BP (!) 140/83 (BP Location: Left Arm)   Pulse 73   Temp 98.3 F (36.8 C) (Oral)   Resp 18   Ht 5\' 11"  (1.803 m)   Wt 111.1 kg   SpO2 94%   BMI 34.17 kg/m  Physical Exam Vitals and nursing note reviewed. Exam conducted with a chaperone present.  Constitutional:      Appearance: He is well-developed.  HENT:     Head: Normocephalic  and atraumatic.  Eyes:     Conjunctiva/sclera: Conjunctivae normal.  Pulmonary:     Effort: No respiratory distress.  Genitourinary:    Comments: External rectal exam without any discharge, signs of.  No abscess or trauma.  There is a small external hemorrhoid noted at the 12 o'clock position with patient lying prone.  No bleeding or thrombosis. Musculoskeletal:     Cervical back: Normal range of motion and neck supple.  Skin:    General: Skin is warm and dry.  Neurological:     Mental Status: He is alert.     ED Results / Procedures / Treatments   Labs (all labs ordered are listed, but only abnormal results are displayed) Labs Reviewed  GC/CHLAMYDIA PROBE AMP ([place]) NOT AT [facility]  GC/CHLAMYDIA PROBE AMP ([place]) NOT AT [facility]    EKG None  Radiology No results found.  Procedures Procedures    Medications Ordered in ED Medications  cefTRIAXone (ROCEPHIN) injection 500 mg (has no administration in time range)  doxycycline (VIBRA-TABS) tablet 100 mg (has no administration in time range)    ED Course/ Medical Decision Making/ A&P    Patient seen and examined. History obtained directly from patient.   Labs/EKG: Ordered GC chlamydia testing.  Imaging: None ordered  Medications/Fluids: Rocephin/doxycycline for presumed STI.  Most recent vital signs reviewed and are as follows: BP [facility])  140/83 (BP Location: Left Arm)   Pulse 73   Temp 98.3 F (36.8 C) (Oral)   Resp 18   Ht 5\' 11"  (1.803 m)   Wt 111.1 kg   SpO2 94%   BMI 34.17 kg/m   Initial impression: Rectal discharge, concern for STI.  11:17 PM Rectal exam performed with RN chaperone.  After discussion with lab, 2 rectal swabs obtained.  Urine sent.  Prescriptions written for: Doxycycline  ED return instructions discussed: Worsening or changing symptoms.  Follow-up instructions discussed: Patient encouraged to follow-up with their PCP in 1 week for recheck if not improved.                            Medical Decision Making Risk Prescription drug management.   Patient with history of hemorrhoids, with new rectal discharge in setting of unprotected receptive intercourse.  Patient is concerned about STI.  History of HIV.  No signs of cellulitis or trauma.        Final Clinical Impression(s) / ED Diagnoses Final diagnoses:  Rectal discharge  Concern about STD in male without diagnosis    Rx / DC Orders ED Discharge Orders          Ordered    doxycycline (VIBRAMYCIN) 100 MG capsule  2 times daily        08/27/22 2254              08/29/22, PA-C 08/27/22 2317    Long, 08/29/22, MD 08/28/22 1001

## 2022-08-31 LAB — GC/CHLAMYDIA PROBE AMP (~~LOC~~) NOT AT ARMC
Chlamydia: NEGATIVE
Comment: NEGATIVE
Comment: NORMAL
Neisseria Gonorrhea: NEGATIVE

## 2022-09-01 LAB — MISC LABCORP TEST (SEND OUT): Labcorp test code: 188672

## 2022-09-17 ENCOUNTER — Emergency Department (HOSPITAL_BASED_OUTPATIENT_CLINIC_OR_DEPARTMENT_OTHER)
Admission: EM | Admit: 2022-09-17 | Discharge: 2022-09-17 | Disposition: A | Discharge status: Home / Self Care | Payer: BLUE CROSS/BLUE SHIELD | Attending: Emergency Medicine | Admitting: Emergency Medicine

## 2022-09-17 ENCOUNTER — Other Ambulatory Visit: Payer: Self-pay

## 2022-09-17 ENCOUNTER — Encounter (HOSPITAL_BASED_OUTPATIENT_CLINIC_OR_DEPARTMENT_OTHER): Payer: Self-pay | Admitting: Emergency Medicine

## 2022-09-17 DIAGNOSIS — K626 Ulcer of anus and rectum: Secondary | ICD-10-CM | POA: Diagnosis not present

## 2022-09-17 DIAGNOSIS — L989 Disorder of the skin and subcutaneous tissue, unspecified: Secondary | ICD-10-CM | POA: Diagnosis not present

## 2022-09-17 LAB — URINALYSIS, ROUTINE W REFLEX MICROSCOPIC
Bilirubin Urine: NEGATIVE
Glucose, UA: NEGATIVE mg/dL
Hgb urine dipstick: NEGATIVE
Ketones, ur: NEGATIVE mg/dL
Leukocytes,Ua: NEGATIVE
Nitrite: NEGATIVE
Protein, ur: NEGATIVE mg/dL
Specific Gravity, Urine: >=1.03 (ref 1.005–1.030)
pH: 5.5 (ref 5.0–8.0)

## 2022-09-17 NOTE — ED Notes (Signed)
Pt states that his partner stepped out on him and he hasn't been right since , states may be a Herpes outbreak a and has had a bit of blood when he wipes

## 2022-09-17 NOTE — ED Triage Notes (Signed)
States has pain to rectal area, x 2 days. ? Sore

## 2022-09-17 NOTE — ED Provider Notes (Signed)
[facility] EMERGENCY DEPARTMENT Provider Note   CSN: [id number] Arrival date & time: 09/17/22  1102     History  Chief Complaint  Patient presents with   Rectal Pain    Steven [name] is a 21 y.o. male.  Pt complains of a sore in the rectal area.  Pt reports his partner may have exposed him to an STD.  Pt had had previous hemmorhoids.  Pt reports this area is an open sore   The history is provided by the patient. No language interpreter was used.       Home Medications Prior to Admission medications   Medication Sig Start Date End Date Taking? Authorizing Provider  bictegravir-emtricitabine-tenofovir AF (BIKTARVY) 50-200-25 MG TABS tablet Take 1 tablet by mouth daily. 09/09/20   [provider]  doxycycline (VIBRAMYCIN) 100 MG capsule Take 1 capsule (100 mg total) by mouth 2 (two) times daily. 08/27/22   Renne Crigler, PA-C  hydrocortisone (ANUSOL-HC) 2.5 % rectal cream Place 1 Application rectally 2 (two) times daily. 05/28/22   Raspet, Noberto Retort, PA-C  hydrocortisone (ANUSOL-HC) 25 MG suppository Place 1 suppository (25 mg total) rectally 2 (two) times daily. 05/28/22   Raspet, Denny Peon K, PA-C  lidocaine (XYLOCAINE) 5 % ointment Apply 1 application topically 4 (four) times daily as needed. 10/01/21   Tilden Fossa, MD      Allergies    Patient has no known allergies.    Review of Systems   Review of Systems  All other systems reviewed and are negative.   Physical Exam Updated Vital Signs BP (!) 134/107 (BP Location: Right Arm)   Pulse 65   Temp 98.6 F (37 C) (Oral)   Resp 20   Ht 5\' 11"  (1.803 m)   Wt 109.8 kg   SpO2 100%   BMI 33.75 kg/m  Physical Exam Vitals and nursing note reviewed.  Constitutional:      Appearance: He is well-developed.  HENT:     Head: Normocephalic.     Mouth/Throat:     Mouth: Mucous membranes are moist.  Cardiovascular:     Rate and Rhythm: Normal rate and regular rhythm.  Pulmonary:     Effort: Pulmonary  effort is normal.  Abdominal:     General: There is no distension.  Genitourinary:    Comments: Superficial abrasion midline between buttocks.  ( Area does not look herpetic)  Musculoskeletal:        General: Normal range of motion.     Cervical back: Normal range of motion.  Skin:    General: Skin is warm.  Neurological:     General: No focal deficit present.     Mental Status: He is alert and oriented to person, place, and time.  Psychiatric:        Mood and Affect: Mood normal.     ED Results / Procedures / Treatments   Labs (all labs ordered are listed, but only abnormal results are displayed) Labs Reviewed  URINALYSIS, ROUTINE W REFLEX MICROSCOPIC  GC/CHLAMYDIA PROBE AMP ([place]) NOT AT [facility]    EKG None  Radiology No results found.  Procedures Procedures    Medications Ordered in ED Medications - No data to display  ED Course/ Medical Decision Making/ A&P                           Medical Decision Making Pt complains of a sore between his buttock.  Pt concerned aobut std  Amount and/or Complexity of Data Reviewed Labs: ordered.  Risk Risk Details: HSV test ordered and is pending.             Final Clinical Impression(s) / ED Diagnoses Final diagnoses:  Skin lesion    Rx / DC Orders ED Discharge Orders     None     An After Visit Summary was printed and given to the patient.     Elson Areas, Cordelia Poche 09/17/22 1251    Cathren Laine, MD 09/17/22 1308

## 2022-09-17 NOTE — Discharge Instructions (Addendum)
YOur test is pending

## 2022-09-18 LAB — GC/CHLAMYDIA PROBE AMP (~~LOC~~) NOT AT ARMC
Chlamydia: NEGATIVE
Comment: NEGATIVE
Comment: NORMAL
Neisseria Gonorrhea: NEGATIVE

## 2022-09-20 LAB — HSV CULTURE AND TYPING

## 2022-11-18 ENCOUNTER — Encounter (HOSPITAL_BASED_OUTPATIENT_CLINIC_OR_DEPARTMENT_OTHER): Payer: Self-pay | Admitting: Pediatrics

## 2022-11-18 ENCOUNTER — Emergency Department (HOSPITAL_BASED_OUTPATIENT_CLINIC_OR_DEPARTMENT_OTHER)
Admission: EM | Admit: 2022-11-18 | Discharge: 2022-11-18 | Disposition: A | Discharge status: Home / Self Care | Payer: Federal, State, Local not specified - PPO | Attending: Emergency Medicine | Admitting: Emergency Medicine

## 2022-11-18 ENCOUNTER — Other Ambulatory Visit: Payer: Self-pay

## 2022-11-18 DIAGNOSIS — M25512 Pain in left shoulder: Secondary | ICD-10-CM | POA: Diagnosis not present

## 2022-11-18 DIAGNOSIS — M546 Pain in thoracic spine: Secondary | ICD-10-CM | POA: Diagnosis not present

## 2022-11-18 DIAGNOSIS — M25511 Pain in right shoulder: Secondary | ICD-10-CM | POA: Insufficient documentation

## 2022-11-18 DIAGNOSIS — Y92410 Unspecified street and highway as the place of occurrence of the external cause: Secondary | ICD-10-CM | POA: Insufficient documentation

## 2022-11-18 DIAGNOSIS — M542 Cervicalgia: Secondary | ICD-10-CM | POA: Insufficient documentation

## 2022-11-18 DIAGNOSIS — S0990XA Unspecified injury of head, initial encounter: Secondary | ICD-10-CM | POA: Insufficient documentation

## 2022-11-18 DIAGNOSIS — S161XXA Strain of muscle, fascia and tendon at neck level, initial encounter: Secondary | ICD-10-CM | POA: Diagnosis not present

## 2022-11-18 MED ORDER — KETOROLAC TROMETHAMINE 10 MG PO TABS: 10.0000 mg | ORAL_TABLET | Freq: Four times a day (QID) | ORAL | 0 refills | Status: DC | PRN

## 2022-11-18 MED ORDER — CYCLOBENZAPRINE HCL 5 MG PO TABS
5.0000 mg | ORAL_TABLET | Freq: Once | ORAL | Status: AC
  Administered 2022-11-18: 5 mg via ORAL
  Filled 2022-11-18: qty 1

## 2022-11-18 MED ORDER — KETOROLAC TROMETHAMINE 30 MG/ML IJ SOLN
30.0000 mg | Freq: Once | INTRAMUSCULAR | Status: AC
  Administered 2022-11-18: 30 mg via INTRAMUSCULAR
  Filled 2022-11-18: qty 1

## 2022-11-18 MED ORDER — LIDOCAINE 4 % EX PTCH: 1.0000 | MEDICATED_PATCH | CUTANEOUS | 0 refills | Status: DC

## 2022-11-18 MED ORDER — CYCLOBENZAPRINE HCL 5 MG PO TABS: 5.0000 mg | ORAL_TABLET | Freq: Two times a day (BID) | ORAL | 0 refills | Status: DC | PRN

## 2022-11-18 MED ORDER — LIDOCAINE 5 % EX PTCH
2.0000 | MEDICATED_PATCH | CUTANEOUS | Status: DC
  Administered 2022-11-18: 2 via TRANSDERMAL
  Filled 2022-11-18: qty 2

## 2022-11-18 NOTE — Discharge Instructions (Addendum)
Please follow-up with your primary care doctor, return to the ER if you have any confusion, nausea, vomiting that is intractable.  Make sure stone watches you for the next 24 hours, and check on your mental status to make sure there is no decline.
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
## 2023-01-10 ENCOUNTER — Encounter (HOSPITAL_COMMUNITY): Payer: Self-pay

## 2023-01-10 ENCOUNTER — Emergency Department (HOSPITAL_COMMUNITY)
Admission: EM | Admit: 2023-01-10 | Discharge: 2023-01-10 | Disposition: A | Discharge status: Home / Self Care | Payer: Federal, State, Local not specified - PPO | Attending: Emergency Medicine | Admitting: Emergency Medicine

## 2023-01-10 DIAGNOSIS — Z21 Asymptomatic human immunodeficiency virus [HIV] infection status: Secondary | ICD-10-CM | POA: Insufficient documentation

## 2023-01-10 DIAGNOSIS — K602 Anal fissure, unspecified: Secondary | ICD-10-CM | POA: Insufficient documentation

## 2023-01-10 DIAGNOSIS — D72829 Elevated white blood cell count, unspecified: Secondary | ICD-10-CM | POA: Insufficient documentation

## 2023-01-10 DIAGNOSIS — K625 Hemorrhage of anus and rectum: Secondary | ICD-10-CM | POA: Diagnosis not present

## 2023-01-10 LAB — COMPREHENSIVE METABOLIC PANEL
ALT: 35 U/L (ref 0–44)
AST: 30 U/L (ref 15–41)
Albumin: 4.8 g/dL (ref 3.5–5.0)
Alkaline Phosphatase: 58 U/L (ref 38–126)
Anion gap: 8 (ref 5–15)
BUN: 18 mg/dL (ref 6–20)
CO2: 25 mmol/L (ref 22–32)
Calcium: 9.2 mg/dL (ref 8.9–10.3)
Chloride: 104 mmol/L (ref 98–111)
Creatinine, Ser: 1.03 mg/dL (ref 0.61–1.24)
GFR, Estimated: >60 mL/min (ref >60)
Glucose, Bld: 99 mg/dL (ref 70–99)
Potassium: 3.8 mmol/L (ref 3.5–5.1)
Sodium: 137 mmol/L (ref 135–145)
Total Bilirubin: 1 mg/dL (ref 0.3–1.2)
Total Protein: 8.2 g/dL — ABNORMAL HIGH (ref 6.5–8.1)

## 2023-01-10 LAB — CBC WITH DIFFERENTIAL/PLATELET
Abs Immature Granulocytes: 0.01 10*3/uL (ref 0.00–0.07)
Basophils Absolute: 0 10*3/uL (ref 0.0–0.1)
Basophils Relative: 1 %
Eosinophils Absolute: 0 10*3/uL (ref 0.0–0.5)
Eosinophils Relative: 0 %
HCT: 47.7 % (ref 39.0–52.0)
Hemoglobin: 15.5 g/dL (ref 13.0–17.0)
Immature Granulocytes: 0 %
Lymphocytes Relative: 37 %
Lymphs Abs: 1.8 10*3/uL (ref 0.7–4.0)
MCH: 28.2 pg (ref 26.0–34.0)
MCHC: 32.5 g/dL (ref 30.0–36.0)
MCV: 86.9 fL (ref 80.0–100.0)
Monocytes Absolute: 0.5 10*3/uL (ref 0.1–1.0)
Monocytes Relative: 11 %
Neutro Abs: 2.5 10*3/uL (ref 1.7–7.7)
Neutrophils Relative %: 51 %
Platelets: 289 10*3/uL (ref 150–400)
RBC: 5.49 MIL/uL (ref 4.22–5.81)
RDW: 13.7 % (ref 11.5–15.5)
WBC: 4.9 10*3/uL (ref 4.0–10.5)
nRBC: 0 % (ref 0.0–0.2)

## 2023-01-10 MED ORDER — POLYETHYLENE GLYCOL 3350 17 G PO PACK: 17.0000 g | PACK | Freq: Every day | ORAL | 0 refills | Status: DC

## 2023-01-10 MED ORDER — PRAMOXINE HCL (PERIANAL) 1 % EX FOAM: 1.0000 | Freq: Three times a day (TID) | CUTANEOUS | 0 refills | Status: DC | PRN

## 2023-01-10 MED ORDER — DOCUSATE SODIUM 100 MG PO CAPS: 100.0000 mg | ORAL_CAPSULE | Freq: Two times a day (BID) | ORAL | 0 refills | Status: AC

## 2023-01-10 MED ORDER — SENNA 8.6 MG PO TABS: 1.0000 | ORAL_TABLET | Freq: Every day | ORAL | 0 refills | Status: AC

## 2023-01-10 NOTE — Discharge Instructions (Addendum)
You were seen for rectal bleeding. Your labs were very reassuring and you do not require transfusion. Will be called back if any of your infectious swabs are positive. Follow-up with gastroenterology listed in your discharge paperwork by calling to make an appointment. Continue to drink plenty of fluids and eat a high-fiber diet to help with constipation as well as taking the stool softener as prescribed including MiraLAX daily, docusate and senna.  You can use the Proctofoam as needed for pain in the rectal region.  Come back if any heavy bleeding, passing large clots, fainting, chest pain, shortness of breath, vomiting blood, severe abdominal pain, or any other symptoms concerning to you.

## 2023-01-10 NOTE — ED Triage Notes (Signed)
Pt reports rectal bleeding that started this morning at 5 am when using the bathroom, pt reports internal hemorrhoids. Pt reports "light red" in color. Denies n/v or abd pain.VSS, NAD noted.

## 2023-01-10 NOTE — ED Provider Notes (Signed)
[name] EMERGENCY DEPARTMENT AT [name] Provider Note   CSN: [id number] Arrival date & time: 01/10/23  2111     History  Chief Complaint  Patient presents with   Rectal Bleeding    Steven [name] is a 22 y.o. male.  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  Has had previous history of hemorrhoids.  When he was wiping this morning he saw a light red blood on his tissue.  There was no blood clots or large bleeding in his toilet.  He has been having no fevers, no chills, no nausea, no vomiting, no abdominal pain.  He reports having normal bowel movements and not straining or severe constipation recently.  He wanted to be checked out to make sure he was okay.  He denies any recent receptive anal intercourse or drainage from rectum.  No CP, no SOB, no syncope. No hematemesis.    Rectal Bleeding      Home Medications Prior to Admission medications   Medication Sig Start Date End Date Taking? Authorizing Provider  docusate sodium (COLACE) 100 MG capsule Take 1 capsule (100 mg total) by mouth every 12 (twelve) hours. 01/10/23 02/09/23 Yes Mardene Sayer, MD  polyethylene glycol (MIRALAX) 17 g packet Take 17 g by mouth daily. 01/10/23  Yes Mardene Sayer, MD  pramoxine (PROCTOFOAM) 1 % foam Place 1 Application rectally 3 (three) times daily as needed for anal irritation. 01/10/23  Yes Mardene Sayer, MD  senna (SENOKOT) 8.6 MG TABS tablet Take 1 tablet (8.6 mg total) by mouth daily. 01/10/23 02/09/23 Yes Mardene Sayer, MD  bictegravir-emtricitabine-tenofovir AF (BIKTARVY) 50-200-25 MG TABS tablet Take 1 tablet by mouth daily. 09/09/20   [provider]  cyclobenzaprine (FLEXERIL) 5 MG tablet Take 1 tablet (5 mg total) by mouth 2 (two) times daily as needed for muscle spasms. 11/18/22   Small, Brooke L, PA  doxycycline (VIBRAMYCIN) 100 MG capsule Take 1 capsule (100 mg total) by  mouth 2 (two) times daily. 08/27/22   Renne Crigler, PA-C  hydrocortisone (ANUSOL-HC) 2.5 % rectal cream Place 1 Application rectally 2 (two) times daily. 05/28/22   Raspet, Noberto Retort, PA-C  hydrocortisone (ANUSOL-HC) 25 MG suppository Place 1 suppository (25 mg total) rectally 2 (two) times daily. 05/28/22   Raspet, Noberto Retort, PA-C  ketorolac (TORADOL) 10 MG tablet Take 1 tablet (10 mg total) by mouth every 6 (six) hours as needed. 11/18/22   Small, Brooke L, PA  lidocaine (HM LIDOCAINE PATCH) 4 % Place 1 patch onto the skin daily. 11/18/22   Small, Brooke L, PA      Allergies    Patient has no known allergies.    Review of Systems   Review of Systems  Gastrointestinal:  Positive for hematochezia.    Physical Exam Updated Vital Signs BP 125/78   Pulse 80   Temp 98.4 F (36.9 C) (Oral)   Resp 18   Ht 5\' 11"  (1.803 m)   Wt 104.3 kg   SpO2 100%   BMI 32.08 kg/m  Physical Exam Constitutional: Alert and oriented. Well appearing and in no distress. Eyes: Conjunctivae are normal. ENT      Head: Normocephalic and atraumatic. Cardiovascular: Regular rate and rhythm Respiratory: Normal respiratory effort.  O2 sat 100 on RA Gastrointestinal: Soft and nontender.  Performed rectal exam with bedside nurse as chaperone.  There was a hemostatic small anal fissure at the  posterior anal midline, tenderness on rectal exam. No bleeding, no drainage, no BRBPR, no melena. Musculoskeletal: Normal range of motion in all extremities.      Right lower leg: No tenderness or edema.      Left lower leg: No tenderness or edema. Neurologic: Normal speech and language. No gross focal neurologic deficits are appreciated. Skin: Skin is warm, dry and intact. No rash noted. Psychiatric: Mood and affect are normal. Speech and behavior are normal.  ED Results / Procedures / Treatments   Labs (all labs ordered are listed, but only abnormal results are displayed) Labs Reviewed  COMPREHENSIVE METABOLIC PANEL -  Abnormal; Notable for the following components:      Result Value   Total Protein 8.2 (*)    All other components within normal limits  CBC WITH DIFFERENTIAL/PLATELET  RPR  GC/CHLAMYDIA PROBE AMP ([place]) NOT AT [facility]    EKG None  Radiology No results found.  Procedures Procedures    Medications Ordered in ED Medications - No data to display  ED Course/ Medical Decision Making/ A&P                             Medical Decision Making Steven [name] is a 22 y.o. male.  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   Patient's exam consistent with anal fissure.  There is no active rectal bleeding on exam, no purulence, no external hemorrhoids.  He has known history of hemorrhoids.  His bleeding is likely secondary to anal fissure.  He is hemodynamically stable and labs are unremarkable hemoglobin 15.5 platelets 289, no concern for coagulopathy or active bleeding requiring further workup at this time.  His white blood cell count is 4.9 and within normal limits.  He is denying any recent anal penetration or new sexual partners.  He has no concern for STIs but I have sent for GC/CT and syphilis to ensure not consistent with infectious proctitis.  Patient aware he will be called back if infectious test positive.  I have sent him out with bowel regimen and GI information for follow-up and further workup/colonoscopy.  Return precautions discussed.  He is in agreement with plan and discharged in good condition.  Amount and/or Complexity of Data Reviewed Labs: ordered.  Risk OTC drugs.   { Final Clinical Impression(s) / ED Diagnoses Final diagnoses:  Anal fissure  Rectal bleeding    Rx / DC Orders ED Discharge Orders          Ordered    polyethylene glycol (MIRALAX) 17 g packet  Daily        01/10/23 1019    docusate sodium (COLACE) 100 MG capsule  Every 12 hours        01/10/23 1019    senna  (SENOKOT) 8.6 MG TABS tablet  Daily        01/10/23 1019    pramoxine (PROCTOFOAM) 1 % foam  3 times daily PRN        01/10/23 1019              Mardene Sayer, MD 01/10/23 1731

## 2023-01-11 LAB — RPR: RPR Ser Ql: NONREACTIVE

## 2023-01-12 LAB — GC/CHLAMYDIA PROBE AMP (~~LOC~~) NOT AT ARMC
Chlamydia: NEGATIVE
Comment: NEGATIVE
Comment: NORMAL
Neisseria Gonorrhea: NEGATIVE

## 2023-02-12 ENCOUNTER — Emergency Department (HOSPITAL_COMMUNITY)
Admission: EM | Admit: 2023-02-12 | Discharge: 2023-02-12 | Discharge status: Against Medical Advice | Payer: Federal, State, Local not specified - PPO | Attending: Emergency Medicine | Admitting: Emergency Medicine

## 2023-02-12 ENCOUNTER — Other Ambulatory Visit: Payer: Self-pay

## 2023-02-12 ENCOUNTER — Encounter (HOSPITAL_COMMUNITY): Payer: Self-pay

## 2023-02-12 DIAGNOSIS — R1030 Lower abdominal pain, unspecified: Secondary | ICD-10-CM | POA: Insufficient documentation

## 2023-02-12 DIAGNOSIS — Z5321 Procedure and treatment not carried out due to patient leaving prior to being seen by health care provider: Secondary | ICD-10-CM | POA: Insufficient documentation

## 2023-02-12 DIAGNOSIS — R1110 Vomiting, unspecified: Secondary | ICD-10-CM | POA: Diagnosis not present

## 2023-02-12 DIAGNOSIS — R1111 Vomiting without nausea: Secondary | ICD-10-CM | POA: Diagnosis not present

## 2023-02-12 DIAGNOSIS — R1084 Generalized abdominal pain: Secondary | ICD-10-CM | POA: Diagnosis not present

## 2023-02-12 LAB — COMPREHENSIVE METABOLIC PANEL
ALT: 46 U/L — ABNORMAL HIGH (ref 0–44)
AST: 27 U/L (ref 15–41)
Albumin: 4.3 g/dL (ref 3.5–5.0)
Alkaline Phosphatase: 56 U/L (ref 38–126)
Anion gap: 8 (ref 5–15)
BUN: 11 mg/dL (ref 6–20)
CO2: 25 mmol/L (ref 22–32)
Calcium: 9.3 mg/dL (ref 8.9–10.3)
Chloride: 105 mmol/L (ref 98–111)
Creatinine, Ser: 0.85 mg/dL (ref 0.61–1.24)
GFR, Estimated: >60 mL/min (ref >60)
Glucose, Bld: 105 mg/dL — ABNORMAL HIGH (ref 70–99)
Potassium: 4 mmol/L (ref 3.5–5.1)
Sodium: 138 mmol/L (ref 135–145)
Total Bilirubin: 0.7 mg/dL (ref 0.3–1.2)
Total Protein: 7.7 g/dL (ref 6.5–8.1)

## 2023-02-12 LAB — CBC
HCT: 47.8 % (ref 39.0–52.0)
Hemoglobin: 15.3 g/dL (ref 13.0–17.0)
MCH: 28.3 pg (ref 26.0–34.0)
MCHC: 32 g/dL (ref 30.0–36.0)
MCV: 88.4 fL (ref 80.0–100.0)
Platelets: 263 10*3/uL (ref 150–400)
RBC: 5.41 MIL/uL (ref 4.22–5.81)
RDW: 13.2 % (ref 11.5–15.5)
WBC: 5.7 10*3/uL (ref 4.0–10.5)
nRBC: 0 % (ref 0.0–0.2)

## 2023-02-12 LAB — LIPASE, BLOOD: Lipase: 23 U/L (ref 11–51)

## 2023-02-12 NOTE — ED Triage Notes (Signed)
Patient BIB [facility] from home. Sudden onset of lower abdominal pain in the middle along with vomiting. Thought he was constipated and tried to take miralax but vomited it right back up.

## 2023-02-12 NOTE — ED Notes (Signed)
Patient attempted urine sample. Unsuccessful.  °

## 2023-02-12 NOTE — ED Notes (Signed)
Patient was seen leaving by private vehicle in the EMS parking lot.

## 2023-05-03 ENCOUNTER — Encounter (HOSPITAL_COMMUNITY): Payer: Self-pay

## 2023-05-03 ENCOUNTER — Emergency Department (HOSPITAL_COMMUNITY)
Admission: EM | Admit: 2023-05-03 | Discharge: 2023-05-03 | Disposition: A | Discharge status: Home / Self Care | Payer: Federal, State, Local not specified - PPO | Attending: Emergency Medicine | Admitting: Emergency Medicine

## 2023-05-03 ENCOUNTER — Other Ambulatory Visit: Payer: Self-pay

## 2023-05-03 DIAGNOSIS — M79652 Pain in left thigh: Secondary | ICD-10-CM | POA: Insufficient documentation

## 2023-05-03 DIAGNOSIS — M79651 Pain in right thigh: Secondary | ICD-10-CM | POA: Diagnosis not present

## 2023-05-03 MED ORDER — LIDOCAINE 5 % EX PTCH
2.0000 | MEDICATED_PATCH | CUTANEOUS | Status: DC
  Administered 2023-05-03: 2 via TRANSDERMAL
  Filled 2023-05-03: qty 2

## 2023-05-03 MED ORDER — LIDOCAINE 4 % EX PTCH: 1.0000 | MEDICATED_PATCH | CUTANEOUS | 0 refills | Status: DC

## 2023-05-03 MED ORDER — KETOROLAC TROMETHAMINE 30 MG/ML IJ SOLN
30.0000 mg | Freq: Once | INTRAMUSCULAR | Status: AC
  Administered 2023-05-03: 30 mg via INTRAMUSCULAR
  Filled 2023-05-03: qty 1
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
## 2023-05-03 NOTE — Discharge Instructions (Addendum)
Please use Tylenol or ibuprofen for pain.  You may use 600 mg ibuprofen every 6 hours or 1000 mg of Tylenol every 6 hours.  You may choose to alternate between the 2.  This would be most effective.  Not to exceed 4 g of Tylenol within 24 hours.  Not to exceed 3200 mg ibuprofen 24 hours.   Follow up with primary care -- discuss PT (if still having symptoms).

## 2023-05-03 NOTE — ED Triage Notes (Signed)
Pt presenting today for a constant dull pain in the left leg that started apprx 2 wks ago. Denies any swelling or redness.

## 2023-07-02 ENCOUNTER — Other Ambulatory Visit: Payer: Self-pay

## 2023-07-02 ENCOUNTER — Emergency Department (HOSPITAL_BASED_OUTPATIENT_CLINIC_OR_DEPARTMENT_OTHER)
Admission: EM | Admit: 2023-07-02 | Discharge: 2023-07-02 | Disposition: A | Discharge status: Home / Self Care | Payer: Federal, State, Local not specified - PPO | Attending: Emergency Medicine | Admitting: Emergency Medicine

## 2023-07-02 ENCOUNTER — Encounter (HOSPITAL_BASED_OUTPATIENT_CLINIC_OR_DEPARTMENT_OTHER): Payer: Self-pay

## 2023-07-02 DIAGNOSIS — N4889 Other specified disorders of penis: Secondary | ICD-10-CM | POA: Insufficient documentation

## 2023-07-02 DIAGNOSIS — Z21 Asymptomatic human immunodeficiency virus [HIV] infection status: Secondary | ICD-10-CM | POA: Diagnosis not present

## 2023-07-02 DIAGNOSIS — Z1152 Encounter for screening for COVID-19: Secondary | ICD-10-CM | POA: Diagnosis not present

## 2023-07-02 DIAGNOSIS — N489 Disorder of penis, unspecified: Secondary | ICD-10-CM

## 2023-07-02 DIAGNOSIS — R369 Urethral discharge, unspecified: Secondary | ICD-10-CM | POA: Diagnosis not present

## 2023-07-02 DIAGNOSIS — Z711 Person with feared health complaint in whom no diagnosis is made: Secondary | ICD-10-CM

## 2023-07-02 DIAGNOSIS — Z202 Contact with and (suspected) exposure to infections with a predominantly sexual mode of transmission: Secondary | ICD-10-CM | POA: Diagnosis not present

## 2023-07-02 LAB — URINALYSIS, W/ REFLEX TO CULTURE (INFECTION SUSPECTED)
Bilirubin Urine: NEGATIVE
Glucose, UA: NEGATIVE mg/dL
Hgb urine dipstick: NEGATIVE
Ketones, ur: NEGATIVE mg/dL
Leukocytes,Ua: NEGATIVE
Nitrite: NEGATIVE
Protein, ur: NEGATIVE mg/dL
Specific Gravity, Urine: 1.025 (ref 1.005–1.030)
pH: 7.5 (ref 5.0–8.0)

## 2023-07-02 LAB — SARS CORONAVIRUS 2 BY RT PCR: SARS Coronavirus 2 by RT PCR: NEGATIVE

## 2023-07-02 MED ORDER — CEFTRIAXONE SODIUM 500 MG IJ SOLR
500.0000 mg | Freq: Once | INTRAMUSCULAR | Status: AC
  Administered 2023-07-02: 500 mg via INTRAMUSCULAR
  Filled 2023-07-02: qty 500

## 2023-07-02 MED ORDER — DOXYCYCLINE HYCLATE 100 MG PO TABS: 100.0000 mg | ORAL_TABLET | Freq: Two times a day (BID) | ORAL | 0 refills | Status: DC

## 2023-07-02 MED ORDER — PENICILLIN G BENZATHINE 1200000 UNIT/2ML IM SUSY
2.4000 10*6.[IU] | PREFILLED_SYRINGE | Freq: Once | INTRAMUSCULAR | Status: AC
  Administered 2023-07-02: 2.4 10*6.[IU] via INTRAMUSCULAR
  Filled 2023-07-02: qty 4

## 2023-07-02 MED ORDER — LIDOCAINE HCL (PF) 1 % IJ SOLN
1.0000 mL | Freq: Once | INTRAMUSCULAR | Status: AC
  Administered 2023-07-02: 2 mL
  Filled 2023-07-02: qty 5

## 2023-07-02 MED ORDER — IBUPROFEN 800 MG PO TABS
800.0000 mg | ORAL_TABLET | Freq: Once | ORAL | Status: AC
  Administered 2023-07-02: 800 mg via ORAL
  Filled 2023-07-02: qty 1

## 2023-07-02 NOTE — ED Notes (Signed)
Pt reports 9/10 headache which began this am when he woke up.  He reports URI symptoms and he is here with his significant other who has a URI.

## 2023-07-02 NOTE — Discharge Instructions (Signed)
It was a pleasure taking care of you here in the emergency department  Your STD testing is pending.  You were empirically treated for gonorrhea, chlamydia as well as syphilis.  If positive please make sure to follow-up with your primary care provider  You do recommend abstaining from sexual intercourse until your STD testing is returned.  If positive we recommend no sexual intercourse for 1 week after completion of antibiotics  Return for any worsening symptoms

## 2023-07-02 NOTE — ED Notes (Signed)

## 2023-07-02 NOTE — ED Triage Notes (Signed)
In for eval of headache, spot on his penis, and flu like symptoms. He noticed the lesion last pm. Possible syphilis exposure 1 week ago. Slight dysuria.

## 2023-07-02 NOTE — ED Provider Notes (Signed)
[place] [facility] AT [facility] Provider Note   CSN: [id number] Arrival date & time: 07/02/23  1433    History  Chief Complaint  Patient presents with   Exposure to STD    Steven [name] is a 22 y.o. male history of HIV, prior treated syphilis, seizure disorder here for evaluation of penile lesion.  States he noted yesterday.  Lesion is nontender.  Located distal aspect.  He admits to unprotected MSM intercourse approximately 1 week ago.  Has had some myalgias as well as "flu" like symptoms.  Admits to receptive and rectal penetrative intercourse.  No discharge.  Has had some dysuria.  No numbness, weakness, chest pain, shortness of breath, abdominal pain, AMS, numbness, weakness.  Partner here with similar symptoms.  Requesting testing and treatment for possible STD.  States he was exposed to possible Syphilis 1 week ago.  Patient partner not present for history however present for exam.  Patient ok with partner being present for exam.  HPI     Home Medications Prior to Admission medications   Medication Sig Start Date End Date Taking? Authorizing Provider  doxycycline (VIBRA-TABS) 100 MG tablet Take 1 tablet (100 mg total) by mouth 2 (two) times daily. 07/02/23  Yes Yasuko Lapage A, PA-C  bictegravir-emtricitabine-tenofovir AF (BIKTARVY) 50-200-25 MG TABS tablet Take 1 tablet by mouth daily. 09/09/20   [provider]  cyclobenzaprine (FLEXERIL) 5 MG tablet Take 1 tablet (5 mg total) by mouth 2 (two) times daily as needed for muscle spasms. 11/18/22   Small, Brooke L, PA  hydrocortisone (ANUSOL-HC) 2.5 % rectal cream Place 1 Application rectally 2 (two) times daily. 05/28/22   Raspet, Noberto Retort, PA-C  hydrocortisone (ANUSOL-HC) 25 MG suppository Place 1 suppository (25 mg total) rectally 2 (two) times daily. 05/28/22   Raspet, Noberto Retort, PA-C  ketorolac (TORADOL) 10 MG tablet Take 1 tablet (10 mg total) by mouth every 6 (six) hours as needed. 11/18/22    Small, Brooke L, PA  lidocaine (HM LIDOCAINE PATCH) 4 % Place 1 patch onto the skin daily. 05/03/23   Gailen Shelter, PA  polyethylene glycol (MIRALAX) 17 g packet Take 17 g by mouth daily. 01/10/23   Mardene Sayer, MD  pramoxine (PROCTOFOAM) 1 % foam Place 1 Application rectally 3 (three) times daily as needed for anal irritation. 01/10/23   Mardene Sayer, MD      Allergies    Patient has no known allergies.    Review of Systems   Review of Systems  Constitutional: Negative.   HENT: Negative.    Respiratory: Negative.    Cardiovascular: Negative.   Gastrointestinal: Negative.   Genitourinary:  Positive for genital sores and penile discharge. Negative for decreased urine volume, difficulty urinating, dysuria, enuresis, flank pain, frequency, hematuria, penile pain, penile swelling, scrotal swelling, testicular pain and urgency.  Musculoskeletal: Negative.   Skin: Negative.   Neurological:  Positive for headaches.  All other systems reviewed and are negative.   Physical Exam Updated Vital Signs BP 134/82 (BP Location: Left Arm)   Pulse 70   Temp 98.3 F (36.8 C)   Resp 18   Ht 5\' 11"  (1.803 m)   Wt 106.6 kg   SpO2 99%   BMI 32.78 kg/m  Physical Exam Vitals and nursing note reviewed. Exam conducted with a chaperone present.  Constitutional:      General: He is not in acute distress.    Appearance: He is well-developed. He is not ill-appearing,  toxic-appearing or diaphoretic.  HENT:     Head: Normocephalic and atraumatic.     Nose: Nose normal.     Mouth/Throat:     Mouth: Mucous membranes are moist.  Eyes:     Pupils: Pupils are equal, round, and reactive to light.  Cardiovascular:     Rate and Rhythm: Normal rate and regular rhythm.  Pulmonary:     Effort: Pulmonary effort is normal. No respiratory distress.  Abdominal:     General: There is no distension.     Palpations: Abdomen is soft.     Tenderness: There is no abdominal tenderness. There is no  right CVA tenderness, left CVA tenderness, guarding or rebound.  Genitourinary:    Comments: Nontender 3 mm abrasion versus superficial ulceration distal penis at 10 o'clock position.  No penile drainage.  Rectal exam without any drainage, swab from Rectum and penis for GC obtained.  Chaperone present in room Musculoskeletal:        General: Normal range of motion.     Cervical back: Normal range of motion and neck supple.  Skin:    General: Skin is warm and dry.  Neurological:     General: No focal deficit present.     Mental Status: He is alert and oriented to person, place, and time.    ED Results / Procedures / Treatments   Labs (all labs ordered are listed, but only abnormal results are displayed) Labs Reviewed  URINALYSIS, W/ REFLEX TO CULTURE (INFECTION SUSPECTED) - Abnormal; Notable for the following components:      Result Value   Bacteria, UA MANY (*)    All other components within normal limits  SARS CORONAVIRUS 2 BY RT PCR  RPR  GC/CHLAMYDIA PROBE AMP ([place]) NOT AT [facility]  GC/CHLAMYDIA PROBE AMP ([place]) NOT AT [facility]    EKG None  Radiology No results found.  Procedures Procedures    Medications Ordered in ED Medications  ibuprofen (ADVIL) tablet 800 mg (800 mg Oral Given 07/02/23 1540)  cefTRIAXone (ROCEPHIN) injection 500 mg (500 mg Intramuscular Given 07/02/23 1650)  lidocaine (PF) (XYLOCAINE) 1 % injection 1-2.1 mL (2 mLs Other Given 07/02/23 1650)  penicillin g benzathine (BICILLIN LA) 1200000 UNIT/2ML injection 2.4 Million Units (2.4 Million Units Intramuscular Given 07/02/23 1649)   ED Course/ Medical Decision Making/ A&P   22 year old history of HIV, prior, treat cephalized here for evaluation of penile lesion.  New partner, unprotected intercourse.  Receptive and penetrative intercourse.  States he follows with [facility] however per chart review it looks like he was last seen 1.5 years ago. States he is compliant with his HIV meds.  Also has  headache, URI symptoms.  He is afebrile, nonseptic, not ill-appearing.  Nonfocal neuroexam without deficits. No chest pain, shortness of breath,cough.  Patient is concerned for STD, requesting empiric treatment for STD. Heart and lungs clear, does have 3 mm no drainage.  Will plan on labs, imaging, empiric treatment and reassess.  Plan on GC culture from penis as well as rectum.  With regards to headache, unilateral send he appears otherwise well.  Low suspicion for bleed, infectious process, tertiary syphilis SAH, mass, PCP PNA, PE.  Labs and imaging personally viewed and interpreted:  UA with many bacteria COVID-negative RPR pending  Will treat empirically for STD.  Will have him follow-up closely with infectious disease.  He will return for new or worsening symptoms.  The patient has been appropriately medically screened and/or stabilized in the  ED. I have low suspicion for any other emergent medical condition which would require further screening, evaluation or treatment in the ED or require inpatient management.  Patient is hemodynamically stable and in no acute distress.  Patient able to ambulate in department prior to ED.  Evaluation does not show acute pathology that would require ongoing or additional emergent interventions while in the emergency department or further inpatient treatment.  I have discussed the diagnosis with the patient and answered all questions.  Pain is been managed while in the emergency department and patient has no further complaints prior to discharge.  Patient is comfortable with plan discussed in room and is stable for discharge at this time.  I have discussed strict return precautions for returning to the emergency department.  Patient was encouraged to follow-up with PCP/specialist refer to at discharge.                                  Medical Decision Making Amount and/or Complexity of Data Reviewed Independent Historian: friend External Data Reviewed: labs  and notes. Labs: ordered. Decision-making details documented in ED Course.  Risk OTC drugs. Prescription drug management. Decision regarding hospitalization. Diagnosis or treatment significantly limited by social determinants of health.          Final Clinical Impression(s) / ED Diagnoses Final diagnoses:  Concern about STD in male without diagnosis  Penile lesion    Rx / DC Orders ED Discharge Orders          Ordered    doxycycline (VIBRA-TABS) 100 MG tablet  2 times daily        07/02/23 1602              Louvinia Cumbo A, PA-C 07/02/23 1845    Tegeler, Canary Brim, MD 07/02/23 Ernestina [place]

## 2023-07-03 LAB — RPR: RPR Ser Ql: NONREACTIVE

## 2023-07-05 LAB — GC/CHLAMYDIA PROBE AMP (~~LOC~~) NOT AT ARMC
Chlamydia: NEGATIVE
Chlamydia: NEGATIVE
Comment: NEGATIVE
Comment: NORMAL
Comment: NEGATIVE
Comment: NORMAL
Neisseria Gonorrhea: NEGATIVE
Neisseria Gonorrhea: NEGATIVE

## 2023-09-10 ENCOUNTER — Ambulatory Visit
Admission: EM | Admit: 2023-09-10 | Discharge: 2023-09-10 | Disposition: A | Discharge status: Home / Self Care | Payer: Federal, State, Local not specified - PPO | Attending: Emergency Medicine | Admitting: Emergency Medicine

## 2023-09-10 DIAGNOSIS — K5900 Constipation, unspecified: Secondary | ICD-10-CM | POA: Diagnosis not present

## 2023-09-10 DIAGNOSIS — K644 Residual hemorrhoidal skin tags: Secondary | ICD-10-CM | POA: Insufficient documentation

## 2023-09-10 DIAGNOSIS — Z113 Encounter for screening for infections with a predominantly sexual mode of transmission: Secondary | ICD-10-CM | POA: Diagnosis not present

## 2023-09-10 NOTE — ED Triage Notes (Addendum)
Patient states he had anal sex, noticed a pink fleshy area outside his anus x 1 week. Would like STI testing. Patient also reports lower abdomen pain x 1 month.

## 2023-09-10 NOTE — Discharge Instructions (Addendum)
I have enclosed information about hemorrhoids and constipation that I hope you will find helpful.    At this time, no treatment for hemorrhoids is needed as it is not thrombosed or painful.  I do recommend that you make sure that you are engaging in enough physical activity, least 150 minutes/week or 30 minutes/day for 5 days a week, drinking plenty of water, eating plenty of fresh fruits and vegetables and moving her bowels regularly.  Doing these things will prevent your hemorrhoid from getting worse.  The results of your STD testing today which screens for gonorrhea, chlamydia, and trichomonas will be made posted to your MyChart account once it is complete.  This typically takes 2 to 4 days.  Please abstain from sexual intercourse of any kind, vaginal, oral or anal, until you have received the results of your STD testing.      The results of your HIV and syphilis blood tests will be made available to you once they are complete.  They will initially be posted to your MyChart account which typically takes 2 to 3 days.      If any of your results are abnormal, you will receive a phone call regarding treatment.  Prescriptions, if any are needed, will be provided for you at your pharmacy.      Please remember that there are only 2 ways to prevent transmission of sexually transmitted disease: to abstain from sexual intercourse or to always use condoms when having sexual intercourse. Repeat sexually transmitted infections can reduce your ability to conceive and to have children and increase your risk of contracting syphilis, HIV and HPV, the highly contagious human papilloma virus which causes cervical cancer and genital warts, .      If you have not had complete resolution of your symptoms after completing any recommended treatment or if your symptoms worsen, please return for repeat evaluation.     Thank you for visiting [facility] today.  We appreciate the opportunity to participate in your  care.

## 2023-09-10 NOTE — ED Provider Notes (Signed)
[facility]    CSN: [id number] Arrival date & time: 09/10/23  1241    HISTORY  No chief complaint on file.  HPI Steven [name] is a pleasant, 22 y.o. male who presents to urgent care today. Patient complains of noticing a pink fleshy area on the outside of his anus about a week ago.  Patient states he had unprotected anal sex a few weeks ago. Patient requests routine STD screening.   Patient states that they sometimes use condoms when having sexual intercourse.   Patient states they  do engage in anal. Patient denies penile discharge, testicular pain or swelling, scrotal pain or swelling, perineal pain, rectal pain, pain with defecation, suprapubic pain, burning during urination, fever , body aches, chills, rigors, malaise, and significant fatigue.  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  Patient states he has had hemorrhoids in the past.  The history is provided by the patient.    Past Medical History:  Diagnosis Date   Anxiety    Generalized headaches    GERD (gastroesophageal reflux disease)    HIV (human immunodeficiency virus infection) (HCC)    Seizures (HCC)    last seizure 2 weeks ago   Syncope    Patient Active Problem List   Diagnosis Date Noted   Seizures (HCC) 08/03/2018   Major depressive disorder, single episode, severe without psychotic features (HCC)    Generalized anxiety disorder    Suicidal ideation    Parent-child relationship problem    Unspecified convulsions (HCC) 01/31/2013   Seizure disorder (HCC) 01/30/2013   Headache 01/30/2013   Past Surgical History:  Procedure Laterality Date   CIRCUMCISION  2001    Home Medications    Prior to Admission medications    Medication Sig Start Date End Date Taking? Authorizing Provider  bictegravir-emtricitabine-tenofovir AF (BIKTARVY) 50-200-25 MG TABS tablet Take 1 tablet by mouth daily. 09/09/20   [provider]  cyclobenzaprine (FLEXERIL) 5 MG tablet Take 1 tablet (5 mg total) by mouth 2 (two) times daily as needed for muscle spasms. 11/18/22   Small, Brooke L, PA  doxycycline (VIBRA-TABS) 100 MG tablet Take 1 tablet (100 mg total) by mouth 2 (two) times daily. 07/02/23   Henderly, Britni A, PA-C  hydrocortisone (ANUSOL-HC) 2.5 % rectal cream Place 1 Application rectally 2 (two) times daily. 05/28/22   Raspet, Noberto Retort, PA-C  hydrocortisone (ANUSOL-HC) 25 MG suppository Place 1 suppository (25 mg total) rectally 2 (two) times daily. 05/28/22   Raspet, Noberto Retort, PA-C  ketorolac (TORADOL) 10 MG tablet Take 1 tablet (10 mg total) by mouth every 6 (six) hours as needed. 11/18/22   Small, Brooke L, PA  lidocaine (HM LIDOCAINE PATCH) 4 % Place 1 patch onto the skin daily. 05/03/23   Gailen Shelter, PA  polyethylene glycol (MIRALAX) 17 g packet Take 17 g by mouth daily. 01/10/23   Mardene Sayer, MD  pramoxine (PROCTOFOAM) 1 % foam Place 1 Application rectally 3 (three) times daily as needed for anal irritation. 01/10/23   Mardene Sayer, MD    Family History Family History  Problem Relation Age of Onset   Epilepsy Maternal Grandmother    Epilepsy Paternal  Grandmother    Seizures Maternal Aunt    Seizures Cousin    Depression Sister    Social History Social History   Tobacco Use   Smoking status: Former   Smokeless tobacco: Never  Advertising account planner   Vaping status: Never Used  Substance Use Topics   Alcohol use: Yes    Comment: occ   Drug use: Yes    Frequency: 1.0 times per week    Types: Marijuana   Allergies   Patient has no known allergies.  Review of Systems Review of Systems Pertinent findings revealed after performing a 14 point review of systems has been noted in the history of  present illness.  Physical Exam Vital Signs BP 118/75 (BP Location: Left Arm)   Pulse 69   Temp 98.6 F (37 C) (Oral)   Resp 20   Ht 5\' 11"  (1.803 m)   Wt 230 lb (104.3 kg)   SpO2 97%   BMI 32.08 kg/m   No data found.  Physical Exam Vitals and nursing note reviewed.  Constitutional:      General: He is not in acute distress.    Appearance: Normal appearance. He is normal weight. He is not ill-appearing.  HENT:     Head: Normocephalic and atraumatic.  Eyes:     Extraocular Movements: Extraocular movements intact.     Conjunctiva/sclera: Conjunctivae normal.     Pupils: Pupils are equal, round, and reactive to light.  Cardiovascular:     Rate and Rhythm: Normal rate and regular rhythm.  Pulmonary:     Effort: Pulmonary effort is normal.     Breath sounds: Normal breath sounds.  Abdominal:     General: Abdomen is flat. Bowel sounds are decreased.     Palpations: Abdomen is soft.     Tenderness: There is no abdominal tenderness.  Genitourinary:    Rectum: External hemorrhoid (at 12 o'clock, not thrombosed or TTP) present.  Musculoskeletal:        General: Normal range of motion.     Cervical back: Normal range of motion and neck supple.  Skin:    General: Skin is warm and dry.  Neurological:     General: No focal deficit present.     Mental Status: He is alert and oriented to person, place, and time. Mental status is at baseline.  Psychiatric:        Mood and Affect: Mood normal.        Behavior: Behavior normal.        Thought Content: Thought content normal.        Judgment: Judgment normal.     Visual Acuity Right Eye Distance:   Left Eye Distance:   Bilateral Distance:    Right Eye Near:   Left Eye Near:    Bilateral Near:     UC Couse / Diagnostics / Procedures:     Radiology No results found.  Procedures Procedures (including critical care time) EKG  Pending results:  Labs Reviewed  HIV ANTIBODY (ROUTINE TESTING W REFLEX)  RPR  CYTOLOGY,  (ORAL, ANAL, URETHRAL) ANCILLARY ONLY    Medications Ordered in UC: Medications - No data to display  UC Diagnoses / Final Clinical Impressions(s)   I have reviewed the triage vital signs and the nursing notes.  Pertinent labs & imaging results that were available during my care of the patient were reviewed by me and considered in my medical decision making (see chart for details).    Final diagnoses:  Screening  examination for STD (sexually transmitted disease)  External hemorrhoid  Constipation, unspecified constipation type   STD screening was performed, patient advised that the results be posted to their MyChart and if any of the results are positive, they will be notified by phone, further treatment will be provided as indicated based on results of STD screening. Patient was advised to abstain from sexual intercourse until that they receive the results of their STD testing.  Patient was also advised to use condoms to protect themselves from STD exposure.  Patient provided with information regarding hemorrhoids and constipation for self-care at home.  Conservative care recommended.  Return precautions advised.  Please see discharge instructions below for details of plan of care as provided to patient. ED Prescriptions   None    PDMP not reviewed this encounter.  Disposition Upon Discharge:  Condition: stable for discharge home  Patient presented with concern for an acute illness with associated systemic symptoms and significant discomfort requiring urgent management. In my opinion, this is a condition that a prudent lay person (someone who possesses an average knowledge of health and medicine) may potentially expect to result in complications if not addressed urgently such as respiratory distress, impairment of bodily function or dysfunction of bodily organs.   As such, the patient has been evaluated and assessed, work-up was performed and treatment was provided in alignment with  urgent care protocols and evidence based medicine.  Patient/parent/caregiver has been advised that the patient may require follow up for further testing and/or treatment if the symptoms continue in spite of treatment, as clinically indicated and appropriate.  Routine symptom specific, illness specific and/or disease specific instructions were discussed with the patient and/or caregiver at length.  Prevention strategies for avoiding STD exposure were also discussed.  The patient will follow up with their current PCP if and as advised. If the patient does not currently have a PCP we will assist them in obtaining one.   The patient may need specialty follow up if the symptoms continue, in spite of conservative treatment and management, for further workup, evaluation, consultation and treatment as clinically indicated and appropriate.  Patient/parent/caregiver verbalized understanding and agreement of plan as discussed.  All questions were addressed during visit.  Please see discharge instructions below for further details of plan.    Discharge Instructions      I have enclosed information about hemorrhoids and constipation that I hope you will find helpful.    At this time, no treatment for hemorrhoids is needed as it is not thrombosed or painful.  I do recommend that you make sure that you are engaging in enough physical activity, least 150 minutes/week or 30 minutes/day for 5 days a week, drinking plenty of water, eating plenty of fresh fruits and vegetables and moving her bowels regularly.  Doing these things will prevent your hemorrhoid from getting worse.  The results of your STD testing today which screens for gonorrhea, chlamydia, and trichomonas will be made posted to your MyChart account once it is complete.  This typically takes 2 to 4 days.  Please abstain from sexual intercourse of any kind, vaginal, oral or anal, until you have received the results of your STD testing.      The results  of your HIV and syphilis blood tests will be made available to you once they are complete.  They will initially be posted to your MyChart account which typically takes 2 to 3 days.      If any of your results  are abnormal, you will receive a phone call regarding treatment.  Prescriptions, if any are needed, will be provided for you at your pharmacy.      Please remember that there are only 2 ways to prevent transmission of sexually transmitted disease: to abstain from sexual intercourse or to always use condoms when having sexual intercourse. Repeat sexually transmitted infections can reduce your ability to conceive and to have children and increase your risk of contracting syphilis, HIV and HPV, the highly contagious human papilloma virus which causes cervical cancer and genital warts, .      If you have not had complete resolution of your symptoms after completing any recommended treatment or if your symptoms worsen, please return for repeat evaluation.     Thank you for visiting [facility] today.  We appreciate the opportunity to participate in your care.       This office note has been dictated using Teaching laboratory technician.  Unfortunately, this method of dictation can sometimes lead to typographical or grammatical errors.  I apologize for your inconvenience in advance if this occurs.  Please do not hesitate to reach out to me if clarification is needed.       Theadora Rama Scales, PA-C 09/10/23 1408

## 2023-09-13 ENCOUNTER — Encounter: Payer: Self-pay | Admitting: Emergency Medicine

## 2023-09-13 LAB — HIV 1/2 AB DIFFERENTIATION
HIV 1 Ab: REACTIVE
HIV 2 Ab: NONREACTIVE
NOTE (HIV CONF MULTIP: POSITIVE — AB

## 2023-09-13 LAB — CYTOLOGY, (ORAL, ANAL, URETHRAL) ANCILLARY ONLY
Chlamydia: NEGATIVE
Comment: NEGATIVE
Comment: NEGATIVE
Comment: NORMAL
Neisseria Gonorrhea: NEGATIVE
Trichomonas: NEGATIVE

## 2023-09-13 LAB — RPR: RPR Ser Ql: NONREACTIVE

## 2023-09-13 LAB — HIV ANTIBODY (ROUTINE TESTING W REFLEX): HIV Screen 4th Generation wRfx: REACTIVE

## 2023-09-21 ENCOUNTER — Ambulatory Visit: Payer: Federal, State, Local not specified - PPO | Admitting: Physician Assistant

## 2023-09-28 ENCOUNTER — Ambulatory Visit (INDEPENDENT_AMBULATORY_CARE_PROVIDER_SITE_OTHER)
Admission: EM | Admit: 2023-09-28 | Discharge: 2023-09-28 | Disposition: A | Discharge status: Home / Self Care | Payer: Federal, State, Local not specified - PPO | Source: Home / Self Care

## 2023-09-28 ENCOUNTER — Emergency Department (HOSPITAL_COMMUNITY)
Admission: EM | Admit: 2023-09-28 | Discharge: 2023-09-28 | Discharge status: Against Medical Advice | Payer: Federal, State, Local not specified - PPO | Attending: Emergency Medicine | Admitting: Emergency Medicine

## 2023-09-28 ENCOUNTER — Encounter: Payer: Self-pay | Admitting: Emergency Medicine

## 2023-09-28 ENCOUNTER — Encounter (HOSPITAL_COMMUNITY): Payer: Self-pay

## 2023-09-28 ENCOUNTER — Other Ambulatory Visit: Payer: Self-pay

## 2023-09-28 DIAGNOSIS — M5126 Other intervertebral disc displacement, lumbar region: Secondary | ICD-10-CM | POA: Insufficient documentation

## 2023-09-28 DIAGNOSIS — R519 Headache, unspecified: Secondary | ICD-10-CM | POA: Insufficient documentation

## 2023-09-28 DIAGNOSIS — K602 Anal fissure, unspecified: Secondary | ICD-10-CM

## 2023-09-28 DIAGNOSIS — Z113 Encounter for screening for infections with a predominantly sexual mode of transmission: Secondary | ICD-10-CM | POA: Diagnosis not present

## 2023-09-28 DIAGNOSIS — Z5321 Procedure and treatment not carried out due to patient leaving prior to being seen by health care provider: Secondary | ICD-10-CM | POA: Diagnosis not present

## 2023-09-28 DIAGNOSIS — L290 Pruritus ani: Secondary | ICD-10-CM | POA: Insufficient documentation

## 2023-09-28 DIAGNOSIS — R300 Dysuria: Secondary | ICD-10-CM | POA: Diagnosis not present

## 2023-09-28 DIAGNOSIS — M5020 Other cervical disc displacement, unspecified cervical region: Secondary | ICD-10-CM | POA: Insufficient documentation

## 2023-09-28 LAB — URINALYSIS, ROUTINE W REFLEX MICROSCOPIC
Bilirubin Urine: NEGATIVE
Glucose, UA: NEGATIVE mg/dL
Hgb urine dipstick: NEGATIVE
Ketones, ur: NEGATIVE mg/dL
Leukocytes,Ua: NEGATIVE
Nitrite: NEGATIVE
Protein, ur: NEGATIVE mg/dL
Specific Gravity, Urine: 1.024 (ref 1.005–1.030)
pH: 7 (ref 5.0–8.0)

## 2023-09-28 NOTE — ED Notes (Signed)
Pt was called x3 no answer for vitals.

## 2023-09-28 NOTE — ED Provider Notes (Signed)
 [facility]    CSN: [id number] Arrival date & time: 09/28/23  1434      History   Chief Complaint Chief Complaint  Patient presents with   Rectum Problem    HPI Steven [name] is a 22 y.o. male.   Patient here today for STD screening.  He reports he had same recently and all was negative but he would like this repeated.  He states that he has an area to his anus/ rectum that is itchy and he cannot figure out why.  Same is not painful.  He denies any fevers.  He denies constipation.  He has not had any other symptoms.  The history is provided by the patient.    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   New onset of headaches 09/28/2023   Human immunodeficiency virus (HIV) disease (HCC) 01/23/2020   Seizures (HCC) 08/03/2018   Major depressive disorder, single episode, severe without psychotic features (HCC)    Generalized anxiety disorder    Suicidal ideation    Parent-child relationship problem    History of fainting spells of unknown cause 02/01/2013   Unspecified convulsions (HCC) 01/31/2013   Seizure (HCC) 01/31/2013   Seizure disorder (HCC) 01/30/2013   Headache 01/30/2013    Past Surgical History:  Procedure Laterality Date   CIRCUMCISION  2001       Home Medications    Prior to Admission medications   Medication Sig Start Date End Date Taking? Authorizing Provider  bictegravir-emtricitabine-tenofovir AF (BIKTARVY) 50-200-25 MG TABS tablet Take 1 tablet by mouth daily. 09/09/20  Yes [provider]   doxycycline  (VIBRAMYCIN ) 100 MG capsule Take 100 mg by mouth 2 (two) times daily. 12/10/21  Yes [provider]  meloxicam (MOBIC) 15 MG tablet Take 15 mg by mouth daily. 12/20/20  Yes [provider]  cyclobenzaprine  (FLEXERIL ) 5 MG tablet Take 1 tablet (5 mg total) by mouth 2 (two) times daily as needed for muscle spasms. 11/18/22   Small, Brooke L, PA  doxycycline  (VIBRA -TABS) 100 MG tablet Take 1 tablet (100 mg total) by mouth 2 (two) times daily. 07/02/23   Henderly, Britni A, PA-C  hydrocortisone  (ANUSOL -HC) 2.5 % rectal cream Place 1 Application rectally 2 (two) times daily. 05/28/22   Raspet, Erin K, PA-C  hydrocortisone  (ANUSOL -HC) 25 MG suppository Place 1 suppository (25 mg total) rectally 2 (two) times daily. 05/28/22   Raspet, Erin K, PA-C  ketorolac  (TORADOL ) 10 MG tablet Take 1 tablet (10 mg total) by mouth every 6 (six) hours as needed. 11/18/22   Small, Brooke L, PA  lidocaine  (HM LIDOCAINE  PATCH) 4 % Place 1 patch onto the skin daily. 05/03/23   Neldon Hamp RAMAN, PA  polyethylene glycol (MIRALAX ) 17 g packet Take 17 g by mouth daily. 01/10/23   Branham, Victoria C, MD  pramoxine (PROCTOFOAM) 1 % foam Place 1 Application rectally 3 (three) times daily as needed for anal irritation. 01/10/23   Ethyl Richerd BROCKS, MD    Family History Family History  Problem Relation  Age of Onset   Epilepsy Maternal Grandmother    Epilepsy Paternal Grandmother    Seizures Maternal Aunt    Seizures Cousin    Depression Sister     Social History Social History   Tobacco Use   Smoking status: Former    Types: Cigarettes   Smokeless tobacco: Never  Vaping Use   Vaping status: Never Used  Substance Use Topics   Alcohol use: Yes    Comment: Occassionally.   Drug use: Yes    Frequency: 1.0 times per week    Types: Marijuana     Allergies   Patient has no known allergies.   Review of Systems Review of Systems  Constitutional:  Negative for chills and fever.  Eyes:   Negative for discharge and redness.  Respiratory:  Negative for shortness of breath.   Gastrointestinal:  Negative for blood in stool, constipation, diarrhea, nausea, rectal pain and vomiting.  Neurological:  Negative for numbness.     Physical Exam Triage Vital Signs ED Triage Vitals [09/28/23 1448]  Encounter Vitals Group     BP 107/67     Systolic BP Percentile      Diastolic BP Percentile      Pulse Rate 70     Resp 18     Temp 98 F (36.7 C)     Temp Source Oral     SpO2 98 %     Weight      Height      Head Circumference      Peak Flow      Pain Score      Pain Loc      Pain Education      Exclude from Growth Chart    No data found.  Updated Vital Signs BP 107/67 (BP Location: Left Arm)   Pulse 70   Temp 98 F (36.7 C) (Oral)   Resp 18   SpO2 98%   Visual Acuity Right Eye Distance:   Left Eye Distance:   Bilateral Distance:    Right Eye Near:   Left Eye Near:    Bilateral Near:     Physical Exam Vitals and nursing note reviewed.  Constitutional:      General: He is not in acute distress.    Appearance: Normal appearance. He is not ill-appearing.  HENT:     Head: Normocephalic and atraumatic.  Eyes:     Conjunctiva/sclera: Conjunctivae normal.  Cardiovascular:     Rate and Rhythm: Normal rate.  Pulmonary:     Effort: Pulmonary effort is normal.  Genitourinary:    Comments: Redell Browner, CMA- chaperone  Approximately 1 cm anal fissure without active bleeding noted to 12 o'clock position of anus Neurological:     Mental Status: He is alert.  Psychiatric:        Mood and Affect: Mood normal.        Behavior: Behavior normal.        Thought Content: Thought content normal.      UC Treatments / Results  Labs (all labs ordered are listed, but only abnormal results are displayed) Labs Reviewed  CYTOLOGY, (ORAL, ANAL, URETHRAL) ANCILLARY ONLY  CYTOLOGY, (ORAL, ANAL, URETHRAL) ANCILLARY ONLY    EKG   Radiology No results  found.  Procedures Procedures (including critical care time)  Medications Ordered in UC Medications - No data to display  Initial Impression / Assessment and Plan / UC Course  I have reviewed the triage vital signs and the nursing  notes.  Pertinent labs & imaging results that were available during my care of the patient were reviewed by me and considered in my medical decision making (see chart for details).    Discussed anal fissure and recommended antibiotic ointment and Vaseline to help with healing and prevent itching.  STD screening ordered as requested.  Encouraged follow-up with any further concerns.  Final Clinical Impressions(s) / UC Diagnoses   Final diagnoses:  Anal fissure  Screening for STD (sexually transmitted disease)   Discharge Instructions   None    ED Prescriptions   None    PDMP not reviewed this encounter.   Billy Asberry FALCON, PA-C 09/28/23 1800

## 2023-09-28 NOTE — ED Triage Notes (Signed)
 Pt POV d/t anal itching and some swelling with some blood.  He was told it was a Hemorrhoid at [facility] but says not getting better with Anusol.  Pt also states he is having dysuria.

## 2023-09-28 NOTE — ED Triage Notes (Signed)
"  I came in a few days ago and had STI testing and all were Negative". My rectum is hurting "with a possible bump on it". No fever.   Note: See History with sexual activity updates.

## 2023-09-30 LAB — CYTOLOGY, (ORAL, ANAL, URETHRAL) ANCILLARY ONLY
Chlamydia: NEGATIVE
Chlamydia: NEGATIVE
Comment: NEGATIVE
Comment: NEGATIVE
Comment: NEGATIVE
Comment: NEGATIVE
Comment: NORMAL
Comment: NORMAL
Neisseria Gonorrhea: NEGATIVE
Neisseria Gonorrhea: NEGATIVE
Trichomonas: NEGATIVE
Trichomonas: NEGATIVE

## 2023-12-23 ENCOUNTER — Encounter (HOSPITAL_COMMUNITY): Payer: Self-pay

## 2023-12-23 ENCOUNTER — Emergency Department (HOSPITAL_COMMUNITY)
Admission: EM | Admit: 2023-12-23 | Discharge: 2023-12-23 | Disposition: A | Discharge status: Home / Self Care | Attending: Emergency Medicine | Admitting: Emergency Medicine

## 2023-12-23 ENCOUNTER — Emergency Department (HOSPITAL_COMMUNITY)

## 2023-12-23 ENCOUNTER — Other Ambulatory Visit: Payer: Self-pay

## 2023-12-23 DIAGNOSIS — R079 Chest pain, unspecified: Secondary | ICD-10-CM | POA: Diagnosis not present

## 2023-12-23 DIAGNOSIS — R531 Weakness: Secondary | ICD-10-CM | POA: Diagnosis not present

## 2023-12-23 DIAGNOSIS — R569 Unspecified convulsions: Secondary | ICD-10-CM | POA: Insufficient documentation

## 2023-12-23 DIAGNOSIS — R9431 Abnormal electrocardiogram [ECG] [EKG]: Secondary | ICD-10-CM | POA: Diagnosis not present

## 2023-12-23 DIAGNOSIS — R231 Pallor: Secondary | ICD-10-CM | POA: Diagnosis not present

## 2023-12-23 DIAGNOSIS — R000 Tachycardia, unspecified: Secondary | ICD-10-CM | POA: Diagnosis not present

## 2023-12-23 DIAGNOSIS — R4182 Altered mental status, unspecified: Secondary | ICD-10-CM | POA: Diagnosis not present

## 2023-12-23 LAB — CBC WITH DIFFERENTIAL/PLATELET
Abs Immature Granulocytes: 0.01 10*3/uL (ref 0.00–0.07)
Basophils Absolute: 0 10*3/uL (ref 0.0–0.1)
Basophils Relative: 1 %
Eosinophils Absolute: 0 10*3/uL (ref 0.0–0.5)
Eosinophils Relative: 0 %
HCT: 52.2 % — ABNORMAL HIGH (ref 39.0–52.0)
Hemoglobin: 16.3 g/dL (ref 13.0–17.0)
Immature Granulocytes: 0 %
Lymphocytes Relative: 31 %
Lymphs Abs: 1.5 10*3/uL (ref 0.7–4.0)
MCH: 27.4 pg (ref 26.0–34.0)
MCHC: 31.2 g/dL (ref 30.0–36.0)
MCV: 87.7 fL (ref 80.0–100.0)
Monocytes Absolute: 0.5 10*3/uL (ref 0.1–1.0)
Monocytes Relative: 9 %
Neutro Abs: 2.8 10*3/uL (ref 1.7–7.7)
Neutrophils Relative %: 59 %
Platelets: 282 10*3/uL (ref 150–400)
RBC: 5.95 MIL/uL — ABNORMAL HIGH (ref 4.22–5.81)
RDW: 13.3 % (ref 11.5–15.5)
WBC: 4.8 10*3/uL (ref 4.0–10.5)
nRBC: 0 % (ref 0.0–0.2)

## 2023-12-23 LAB — URINALYSIS, ROUTINE W REFLEX MICROSCOPIC
Bilirubin Urine: NEGATIVE
Glucose, UA: NEGATIVE mg/dL
Hgb urine dipstick: NEGATIVE
Ketones, ur: NEGATIVE mg/dL
Leukocytes,Ua: NEGATIVE
Nitrite: NEGATIVE
Protein, ur: NEGATIVE mg/dL
Specific Gravity, Urine: 1.033 — ABNORMAL HIGH (ref 1.005–1.030)
pH: 5 (ref 5.0–8.0)

## 2023-12-23 LAB — COMPREHENSIVE METABOLIC PANEL WITH GFR
ALT: 31 U/L (ref 0–44)
AST: 23 U/L (ref 15–41)
Albumin: 3.4 g/dL — ABNORMAL LOW (ref 3.5–5.0)
Alkaline Phosphatase: 48 U/L (ref 38–126)
Anion gap: 5 (ref 5–15)
BUN: 12 mg/dL (ref 6–20)
CO2: 22 mmol/L (ref 22–32)
Calcium: 8 mg/dL — ABNORMAL LOW (ref 8.9–10.3)
Chloride: 111 mmol/L (ref 98–111)
Creatinine, Ser: 0.87 mg/dL (ref 0.61–1.24)
GFR, Estimated: >60 mL/min (ref >60)
Glucose, Bld: 117 mg/dL — ABNORMAL HIGH (ref 70–99)
Potassium: 3.4 mmol/L — ABNORMAL LOW (ref 3.5–5.1)
Sodium: 138 mmol/L (ref 135–145)
Total Bilirubin: 0.8 mg/dL (ref 0.0–1.2)
Total Protein: 5.8 g/dL — ABNORMAL LOW (ref 6.5–8.1)

## 2023-12-23 LAB — TROPONIN I (HIGH SENSITIVITY)
Troponin I (High Sensitivity): 5 ng/L (ref <18)
Troponin I (High Sensitivity): 10 ng/L (ref <18)

## 2023-12-23 MED ORDER — SODIUM CHLORIDE 0.9 % IV BOLUS
1000.0000 mL | Freq: Once | INTRAVENOUS | Status: AC
  Administered 2023-12-23: 1000 mL via INTRAVENOUS

## 2023-12-23 MED ORDER — DIPHENHYDRAMINE HCL 50 MG/ML IJ SOLN
25.0000 mg | Freq: Once | INTRAMUSCULAR | Status: AC
  Administered 2023-12-23: 25 mg via INTRAVENOUS
  Filled 2023-12-23: qty 1

## 2023-12-23 MED ORDER — METOCLOPRAMIDE HCL 5 MG/ML IJ SOLN
10.0000 mg | Freq: Once | INTRAMUSCULAR | Status: AC
  Administered 2023-12-23: 10 mg via INTRAVENOUS
  Filled 2023-12-23: qty 2

## 2023-12-23 MED ORDER — LEVETIRACETAM IN NACL 1000 MG/100ML IV SOLN
3000.0000 mg | Freq: Once | INTRAVENOUS | Status: AC
  Administered 2023-12-23: 3000 mg via INTRAVENOUS
  Filled 2023-12-23: qty 300

## 2023-12-23 NOTE — Discharge Instructions (Signed)
 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  Please follow-up with psychiatry and your primary care doctor.  Return to the ER if symptoms worsen.

## 2023-12-23 NOTE — ED Triage Notes (Signed)
 Pt BIB [facility] from work after having 3 seizures all lasting less than a minute per coworkers. Per EMS pt was post-ictal for approx. 1 hour and is Aox4 now. Pt did not fall or hit head. Pt is complaining of R-sided shooting headache and bilateral jaw pain. Pt has hx seizures but it has been years since he's had one or taken seizure meds. Pt reports poor sleep and appetite x2 months. 650 tylenol given by EMS. VSS.

## 2023-12-23 NOTE — ED Provider Notes (Signed)
 [facility]PARTMENT AT [facility] Provider Note   CSN: [id number] Arrival date & time: 12/23/23  1458     History  Chief Complaint  Patient presents with   Seizures    Steven [name] is a 23 y.o. male, history of pseudoseizures, who presents to the ED secondary to a possible seizure witnessed at work.  Patient states he does not really remember what happened, but per the ambulance, patient was at work, and had seizures, with his whole body rocking back and forth.  He seemed very confused, the episode lasted about a minute.  He was not responding, to people speaking to him.  States he has had episodes like this before, but was told that they are pseudoseizures, given stress.  He reports he has had decreased sleep, but not sleep never sleeps well.  Denies any recent illnesses, including chest pain, shortness of breath, fevers, chills.  Does report that he has right-sided head ache, and jaw pain, but denies hitting his head.  Is not on any blood thinners.  Has been compliant with his HIV medication. Denies defecating, or urinating on himself, or biting his tongue Home Medications Prior to Admission medications   Medication Sig Start Date End Date Taking? Authorizing Provider  bictegravir-emtricitabine-tenofovir AF (BIKTARVY) 50-200-25 MG TABS tablet Take 1 tablet by mouth daily. 09/09/20   [provider]  cyclobenzaprine (FLEXERIL) 5 MG tablet Take 1 tablet (5 mg total) by mouth 2 (two) times daily as needed for muscle spasms. 11/18/22   Thos Matsumoto L, PA  doxycycline (VIBRA-TABS) 100 MG tablet Take 1 tablet (100 mg total) by mouth 2 (two) times daily. 07/02/23   Henderly, Britni A, PA-C  doxycycline (VIBRAMYCIN) 100 MG capsule Take 100 mg by mouth 2 (two) times daily. 12/10/21   [provider]  hydrocortisone (ANUSOL-HC) 2.5 % rectal cream Place 1 Application rectally 2 (two) times daily. 05/28/22   Raspet, Noberto Retort, PA-C  hydrocortisone (ANUSOL-HC) 25 MG  suppository Place 1 suppository (25 mg total) rectally 2 (two) times daily. 05/28/22   Raspet, Noberto Retort, PA-C  ketorolac (TORADOL) 10 MG tablet Take 1 tablet (10 mg total) by mouth every 6 (six) hours as needed. 11/18/22   Liara Holm L, PA  lidocaine (HM LIDOCAINE PATCH) 4 % Place 1 patch onto the skin daily. 05/03/23   Gailen Shelter, PA  meloxicam (MOBIC) 15 MG tablet Take 15 mg by mouth daily. 12/20/20   [provider]  polyethylene glycol (MIRALAX) 17 g packet Take 17 g by mouth daily. 01/10/23   Mardene Sayer, MD  pramoxine (PROCTOFOAM) 1 % foam Place 1 Application rectally 3 (three) times daily as needed for anal irritation. 01/10/23   Mardene Sayer, MD      Allergies    Patient has no known allergies.    Review of Systems   Review of Systems  Constitutional:  Negative for fever.  Neurological:  Positive for seizures.    Physical Exam Updated Vital Signs BP (!) 112/52 (BP Location: Right Arm)   Pulse 80   Temp 98.5 F (36.9 C) (Oral)   Resp 12   Ht 5\' 11"  (1.803 m)   Wt 109.8 kg   SpO2 99%   BMI 33.75 kg/m  Physical Exam Vitals and nursing note reviewed.  Constitutional:      General: He is not in acute distress.    Appearance: He is well-developed.  HENT:     Head: Normocephalic and atraumatic.  Right Ear: No hemotympanum.     Left Ear: No hemotympanum.     Nose:     Right Nostril: No septal hematoma.     Left Nostril: No septal hematoma.     Mouth/Throat:     Mouth: Mucous membranes are moist.     Comments: Tenderness to palpation, right jaw, no evidence of any erythema, edema, or ecchymosis. Eyes:     Conjunctiva/sclera: Conjunctivae normal.  Cardiovascular:     Rate and Rhythm: Normal rate and regular rhythm.     Heart sounds: No murmur heard. Pulmonary:     Effort: Pulmonary effort is normal. No respiratory distress.     Breath sounds: Normal breath sounds.  Abdominal:     Palpations: Abdomen is soft.     Tenderness: There is no  abdominal tenderness.  Musculoskeletal:        General: No swelling.     Cervical back: Neck supple.  Skin:    General: Skin is warm and dry.     Capillary Refill: Capillary refill takes less than 2 seconds.  Neurological:     General: No focal deficit present.     Mental Status: He is alert and oriented to person, place, and time.     Cranial Nerves: No cranial nerve deficit.     Sensory: No sensory deficit.  Psychiatric:        Mood and Affect: Mood normal.     ED Results / Procedures / Treatments   Labs (all labs ordered are listed, but only abnormal results are displayed) Labs Reviewed  CBC WITH DIFFERENTIAL/PLATELET - Abnormal; Notable for the following components:      Result Value   RBC 5.95 (*)    HCT 52.2 (*)    All other components within normal limits  URINALYSIS, ROUTINE W REFLEX MICROSCOPIC - Abnormal; Notable for the following components:   APPearance HAZY (*)    Specific Gravity, Urine 1.033 (*)    All other components within normal limits  COMPREHENSIVE METABOLIC PANEL WITH GFR - Abnormal; Notable for the following components:   Potassium 3.4 (*)    Glucose, Bld 117 (*)    Calcium 8.0 (*)    Total Protein 5.8 (*)    Albumin 3.4 (*)    All other components within normal limits  PROLACTIN  TROPONIN I (HIGH SENSITIVITY)  TROPONIN I (HIGH SENSITIVITY)    EKG EKG Interpretation Date/Time:  Thursday December 23 2023 15:06:06 EDT Ventricular Rate:  84 PR Interval:  166 QRS Duration:  98 QT Interval:  356 QTC Calculation: 421 R Axis:   44  Text Interpretation: Sinus rhythm ST elev, probable normal early repol pattern No significant change since last tracing Confirmed by Linwood Dibbles [phone]) on 12/23/2023 3:24:57 PM  Radiology DG Chest 2 View Result Date: 12/23/2023 CLINICAL DATA:  Chest pain. EXAM: CHEST - 2 VIEW COMPARISON:  July 05, 2013. FINDINGS: The heart size and mediastinal contours are within normal limits. Both lungs are clear. The visualized  skeletal structures are unremarkable. IMPRESSION: No active cardiopulmonary disease. Electronically Signed   By: Lupita Raider M.D.   On: 12/23/2023 16:11    Procedures Procedures    Medications Ordered in ED Medications  metoCLOPramide (REGLAN) injection 10 mg (10 mg Intravenous Given 12/23/23 1559)  diphenhydrAMINE (BENADRYL) injection 25 mg (25 mg Intravenous Given 12/23/23 1600)  sodium chloride 0.9 % bolus 1,000 mL (0 mLs Intravenous Stopped 12/23/23 1839)  levETIRAcetam (KEPPRA) IVPB 1000 mg/100 mL premix (0 mg  Intravenous Stopped 12/23/23 1634)    ED Course/ Medical Decision Making/ A&P                                 Medical Decision Making Patient is a 23 year old male, here for seizure-like activity that happened at work.  History is limited, as no one who witnessed it, is present currently.  EMS said the patient had tonic-clonic like activity, and lasted for about a minute and had confusion afterwards.  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  He is overall well-appearing on my exam and not postictal, but this  occurred around 1:50PM.  Amount and/or Complexity of Data Reviewed Labs: ordered.    Details: Unremarkable labs Radiology: ordered.    Details: Chest x-ray clear ECG/medicine tests:  Decision-making details documented in ED Course. Discussion of management or test interpretation with external provider(s): Discussed with patient, headache is feeling better, he has no tenderness to palpation of the jaw currently, believe that his jaw pain may have been referred from his headache.  He had no facial/head trauma per EMS.  And he is overall well-appearing on exam.  I spoke with Dr. Meredith Staggers, neurology, he states given history of pseudoseizures, no need to refer to neurology again, no need to place on any antiepileptic or have seizure precautions.  Will have patient follow-up with psychiatry,  per neurology recommendations, and return if symptoms worsen.  He is overall well-appearing on my exam, and no evidence of any kind of facial trauma.  Risk Prescription drug management.    Final Clinical Impression(s) / ED Diagnoses Final diagnoses:  Seizure-like activity [facility])    Rx / DC Orders ED Discharge Orders     None         Dolphus Jenny, Harley Alto, PA 12/23/23 Grandville Silos, MD 12/24/23 1328

## 2023-12-24 LAB — PROLACTIN: Prolactin: 66.5 ng/mL — ABNORMAL HIGH (ref 3.6–31.5)

## 2023-12-29 ENCOUNTER — Telehealth: Payer: Self-pay

## 2023-12-29 ENCOUNTER — Other Ambulatory Visit (HOSPITAL_COMMUNITY): Payer: Self-pay

## 2023-12-29 NOTE — Telephone Encounter (Signed)
 Pharmacy Patient Advocate Encounter  Insurance verification completed.   The patient is insured through [employer] for [employer]. Currently a quantity of 30 is a 30 day supply and was last filled on 12/21/23 at [facility] .   This test claim was processed through [facility]- copay amounts may vary at other pharmacies due to pharmacy/plan contracts, or as the patient moves through the different stages of their insurance plan.

## 2023-12-31 ENCOUNTER — Ambulatory Visit
Admission: EM | Admit: 2023-12-31 | Discharge: 2023-12-31 | Disposition: A | Discharge status: Home / Self Care | Attending: Family Medicine | Admitting: Family Medicine

## 2023-12-31 ENCOUNTER — Encounter: Payer: Self-pay | Admitting: Emergency Medicine

## 2023-12-31 DIAGNOSIS — E65 Localized adiposity: Secondary | ICD-10-CM | POA: Diagnosis not present

## 2023-12-31 DIAGNOSIS — M542 Cervicalgia: Secondary | ICD-10-CM

## 2023-12-31 MED ORDER — TIZANIDINE HCL 4 MG PO TABS: 4.0000 mg | ORAL_TABLET | Freq: Three times a day (TID) | ORAL | 0 refills | Status: AC | PRN

## 2023-12-31 NOTE — ED Triage Notes (Signed)
 Pt reports chronic neck pain x2 years. 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 No specific trauma or injury that started the pain. Has not seen a medical provider for the condition or taken any medications for pain over the last two years. Describes as intermittent, burning pain. Aggravated with pressure to back of neck and when trying to raise arms over his head. Pt coming in today because he was rubbing the area last night and felt a lump over the area that hurt.

## 2023-12-31 NOTE — Discharge Instructions (Signed)
 You were seen today for neck pain.  A portion of your pain is muscular.  I have sent out a muscle relaxer for pain. This will make you tired/sleepy so please take when home and not driving.  You should use a heating pad/ice pack as well.  You cannot take motrin/advil as it interacts with Biktarvy, so you may take tylenol for pain.  The lump at the back of your neck can be normal for some reason, and likely unrelated to your issue.  Weight loss may help with this.

## 2023-12-31 NOTE — ED Provider Notes (Signed)
 [facility]    CSN: [id number] Arrival date & time: 12/31/23  4098      History   Chief Complaint Chief Complaint  Patient presents with   Neck Pain    HPI Steven [name] is a 23 y.o. male.    Neck Pain  Patient is here for neck pain.  He has had this pain for the last 2 years.  It started in his shoulders, and now has centered into the neck, but more to the right.  No numbness/tingling.  No otc meds taken.  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   New onset of headaches 09/28/2023   Human immunodeficiency virus (HIV) disease (HCC) 01/23/2020   Seizures (HCC) 08/03/2018   Major depressive disorder, single episode, severe without psychotic features (HCC)    Generalized anxiety disorder    Suicidal ideation    Parent-child relationship problem    History of fainting spells of unknown cause 02/01/2013   Unspecified convulsions (HCC) 01/31/2013   Seizure (HCC) 01/31/2013   Seizure disorder (HCC) 01/30/2013   Headache 01/30/2013    Past Surgical History:  Procedure Laterality Date   CIRCUMCISION  August 01, 2000       Home Medications    Prior to Admission medications   Medication Sig Start Date End Date Taking? Authorizing Provider  bictegravir-emtricitabine-tenofovir AF (BIKTARVY) 50-200-25 MG TABS tablet Take 1 tablet by mouth daily. 09/09/20  Yes [provider]  Ferrous Sulfate (IRON PO) Take 1 tablet by mouth daily.   Yes  [provider]  POTASSIUM PO Take 1 tablet by mouth daily. OTC   Yes [provider]  MAGNESIUM PO Take 1 tablet by mouth daily. Patient not taking: Reported on 12/31/2023    [provider]    Family History Family History  Problem Relation Age of Onset   Epilepsy Maternal Grandmother    Epilepsy Paternal Grandmother    Seizures Maternal Aunt    Seizures Cousin    Depression Sister     Social History Social History   Tobacco Use   Smoking status: Former    Types: Cigarettes    Passive exposure: Past   Smokeless tobacco: Never  Vaping Use   Vaping status: Never Used  Substance Use Topics   Alcohol use: Yes    Comment: Occassionally.   Drug use: Yes    Frequency: 1.0 times per week    Types: Marijuana     Allergies   Patient has no known allergies.   Review of Systems Review of Systems  Constitutional: Negative.   HENT: Negative.    Respiratory: Negative.    Cardiovascular: Negative.   Gastrointestinal: Negative.   Genitourinary: Negative.   Musculoskeletal:  Positive for neck pain.  Psychiatric/Behavioral: Negative.       Physical Exam Triage Vital Signs ED Triage Vitals [12/31/23 1001]  Encounter Vitals Group     BP 122/73  Systolic BP Percentile      Diastolic BP Percentile      Pulse Rate 79     Resp 14     Temp 98 F (36.7 C)     Temp Source Oral     SpO2 96 %     Weight      Height      Head Circumference      Peak Flow      Pain Score 5     Pain Loc      Pain Education      Exclude from Growth Chart    No data found.  Updated Vital Signs BP 122/73 (BP Location: Right Arm)   Pulse 79   Temp 98 F (36.7 C) (Oral)   Resp 14   SpO2 96%   Visual Acuity Right Eye Distance:   Left Eye Distance:   Bilateral Distance:    Right Eye Near:   Left Eye Near:    Bilateral Near:     Physical Exam Constitutional:      Appearance: Normal appearance. He is normal weight.  Musculoskeletal:     Comments: He  has spinous tenderness over a fat pad at the neck.  TTP to the right trapezius/upper back;  full rom of the neck without pain or limitation;  mild TTP to the right upper back/trapezius;  pain with movement of the right shoulder  Neurological:     General: No focal deficit present.     Mental Status: He is alert and oriented to person, place, and time.  Psychiatric:        Mood and Affect: Mood normal.      UC Treatments / Results  Labs (all labs ordered are listed, but only abnormal results are displayed) Labs Reviewed - No data to display  EKG   Radiology No results found.  Procedures Procedures (including critical care time)  Medications Ordered in UC Medications - No data to display  Initial Impression / Assessment and Plan / UC Course  I have reviewed the triage vital signs and the nursing notes.  Pertinent labs & imaging results that were available during my care of the patient were reviewed by me and considered in my medical decision making (see chart for details).   Final Clinical Impressions(s) / UC Diagnoses   Final diagnoses:  Neck pain  Dorsocervical fat pad     Discharge Instructions      You were seen today for neck pain.  A portion of your pain is muscular.  I have sent out a muscle relaxer for pain. This will make you tired/sleepy so please take when home and not driving.  You should use a heating pad/ice pack as well.  You cannot take motrin/advil as it interacts with Biktarvy, so you may take tylenol for pain.  The lump at the back of your neck can be normal for some reason, and likely unrelated to your issue.  Weight loss may help with this.     ED Prescriptions     Medication Sig Dispense Auth. Provider   tiZANidine (ZANAFLEX) 4 MG tablet Take 1 tablet (4 mg total) by mouth every 8 (eight) hours as needed for muscle spasms. 30 tablet Jannifer Franklin, MD      PDMP not reviewed this encounter.   Jannifer Franklin, MD 12/31/23 1018

## 2024-01-03 NOTE — Progress Notes (Deleted)
 transfer

## 2024-01-04 ENCOUNTER — Ambulatory Visit: Admitting: Infectious Diseases

## 2024-01-04 ENCOUNTER — Ambulatory Visit: Admitting: Pharmacist

## 2024-01-04 DIAGNOSIS — B20 Human immunodeficiency virus [HIV] disease: Secondary | ICD-10-CM

## 2024-04-11 ENCOUNTER — Other Ambulatory Visit: Payer: Self-pay

## 2024-04-11 DIAGNOSIS — B20 Human immunodeficiency virus [HIV] disease: Secondary | ICD-10-CM

## 2024-04-11 DIAGNOSIS — Z79899 Other long term (current) drug therapy: Secondary | ICD-10-CM

## 2024-04-11 DIAGNOSIS — Z113 Encounter for screening for infections with a predominantly sexual mode of transmission: Secondary | ICD-10-CM

## 2024-04-12 ENCOUNTER — Other Ambulatory Visit

## 2024-04-12 ENCOUNTER — Ambulatory Visit: Payer: Self-pay

## 2024-04-13 ENCOUNTER — Other Ambulatory Visit (HOSPITAL_COMMUNITY): Payer: Self-pay

## 2024-04-14 ENCOUNTER — Other Ambulatory Visit: Payer: Self-pay | Admitting: Infectious Disease

## 2024-04-14 ENCOUNTER — Other Ambulatory Visit: Payer: Self-pay | Admitting: Pharmacist

## 2024-04-14 ENCOUNTER — Other Ambulatory Visit (HOSPITAL_COMMUNITY)
Admission: RE | Admit: 2024-04-14 | Discharge: 2024-04-14 | Disposition: A | Discharge status: Home / Self Care | Source: Ambulatory Visit | Attending: Infectious Disease | Admitting: Infectious Disease

## 2024-04-14 ENCOUNTER — Other Ambulatory Visit: Payer: Self-pay

## 2024-04-14 ENCOUNTER — Other Ambulatory Visit

## 2024-04-14 ENCOUNTER — Ambulatory Visit

## 2024-04-14 DIAGNOSIS — Z113 Encounter for screening for infections with a predominantly sexual mode of transmission: Secondary | ICD-10-CM | POA: Insufficient documentation

## 2024-04-14 DIAGNOSIS — B20 Human immunodeficiency virus [HIV] disease: Secondary | ICD-10-CM

## 2024-04-14 DIAGNOSIS — Z79899 Other long term (current) drug therapy: Secondary | ICD-10-CM

## 2024-04-14 MED ORDER — BICTEGRAVIR-EMTRICITAB-TENOFOV 50-200-25 MG PO TABS: 1.0000 | ORAL_TABLET | Freq: Every day | ORAL | Status: AC

## 2024-04-14 NOTE — Progress Notes (Signed)
 Medication Samples have been provided to the patient.  Drug name: Biktarvy        Strength: 50/200/25 mg       Qty: 7 tablets (1 bottles) LOT: [id number]   Exp.Date: 7/27  Samples requested by Diminique Veva, CMA.  Dosing instructions: Take one tablet by mouth once daily  The patient has been instructed regarding the correct time, dose, and frequency of taking this medication, including desired effects and most common side effects.   Alan Geralds, PharmD, CPP, BCIDP, AAHIVP Clinical Pharmacist Practitioner Infectious Diseases Clinical Pharmacist [facility]

## 2024-04-15 LAB — URINALYSIS
Bilirubin Urine: NEGATIVE
Glucose, UA: NEGATIVE
Hgb urine dipstick: NEGATIVE
Ketones, ur: NEGATIVE
Leukocytes,Ua: NEGATIVE
Nitrite: NEGATIVE
Protein, ur: NEGATIVE
Specific Gravity, Urine: 1.028 (ref 1.001–1.035)
pH: 5.5 (ref 5.0–8.0)

## 2024-04-17 ENCOUNTER — Ambulatory Visit: Payer: Self-pay | Admitting: Pharmacist

## 2024-04-17 ENCOUNTER — Encounter: Payer: Self-pay | Admitting: Infectious Disease

## 2024-04-17 LAB — URINE CYTOLOGY ANCILLARY ONLY
Chlamydia: NEGATIVE
Comment: NEGATIVE
Comment: NORMAL
Neisseria Gonorrhea: NEGATIVE

## 2024-04-18 LAB — HEPATITIS A ANTIBODY, TOTAL: Hepatitis A AB,Total: NONREACTIVE

## 2024-04-18 LAB — COMPLETE METABOLIC PANEL WITHOUT GFR
AG Ratio: 1.8 (calc) (ref 1.0–2.5)
ALT: 48 U/L — ABNORMAL HIGH (ref 9–46)
AST: 24 U/L (ref 10–40)
Albumin: 4.6 g/dL (ref 3.6–5.1)
Alkaline phosphatase (APISO): 76 U/L (ref 36–130)
BUN: 14 mg/dL (ref 7–25)
CO2: 26 mmol/L (ref 20–32)
Calcium: 9.5 mg/dL (ref 8.6–10.3)
Chloride: 105 mmol/L (ref 98–110)
Creat: 0.86 mg/dL (ref 0.60–1.24)
Globulin: 2.5 g/dL (ref 1.9–3.7)
Glucose, Bld: 104 mg/dL — ABNORMAL HIGH (ref 65–99)
Potassium: 4.1 mmol/L (ref 3.5–5.3)
Sodium: 139 mmol/L (ref 135–146)
Total Bilirubin: 0.4 mg/dL (ref 0.2–1.2)
Total Protein: 7.1 g/dL (ref 6.1–8.1)

## 2024-04-18 LAB — QUANTIFERON-TB GOLD PLUS
Mitogen-NIL: 9.37 [IU]/mL
NIL: 0.06 [IU]/mL
QuantiFERON-TB Gold Plus: NEGATIVE
TB1-NIL: 0.01 [IU]/mL
TB2-NIL: 0.03 [IU]/mL

## 2024-04-18 LAB — T-HELPER CELLS (CD4) COUNT (NOT AT ARMC)
Absolute CD4: 797 {cells}/uL (ref 490–1740)
CD4 T Helper %: 38 % (ref 30–61)
Total lymphocyte count: 2124 {cells}/uL (ref 850–3900)

## 2024-04-18 LAB — HEMOGLOBIN A1C
Hgb A1c MFr Bld: 5.7 % — ABNORMAL HIGH (ref <5.7)
Mean Plasma Glucose: 117 mg/dL
eAG (mmol/L): 6.5 mmol/L

## 2024-04-18 LAB — CBC
HCT: 48 % (ref 38.5–50.0)
Hemoglobin: 15.5 g/dL (ref 13.2–17.1)
MCH: 28 pg (ref 27.0–33.0)
MCHC: 32.3 g/dL (ref 32.0–36.0)
MCV: 86.6 fL (ref 80.0–100.0)
MPV: 9.6 fL (ref 7.5–12.5)
Platelets: 284 Thousand/uL (ref 140–400)
RBC: 5.54 Million/uL (ref 4.20–5.80)
RDW: 12.4 % (ref 11.0–15.0)
WBC: 5.6 Thousand/uL (ref 3.8–10.8)

## 2024-04-18 LAB — HIV RNA, RTPCR W/R GT (RTI, PI,INT)
HIV 1 RNA Quant: NOT DETECTED {copies}/mL
HIV-1 RNA Quant, Log: NOT DETECTED {Log_copies}/mL

## 2024-04-18 LAB — LIPID PANEL
Cholesterol: 244 mg/dL — ABNORMAL HIGH (ref <200)
HDL: 45 mg/dL (ref >=40)
LDL Cholesterol (Calc): 166 mg/dL — ABNORMAL HIGH
Non-HDL Cholesterol (Calc): 199 mg/dL — ABNORMAL HIGH (ref <130)
Total CHOL/HDL Ratio: 5.4 (calc) — ABNORMAL HIGH (ref <5.0)
Triglycerides: 173 mg/dL — ABNORMAL HIGH (ref <150)

## 2024-04-18 LAB — HEPATITIS B CORE ANTIBODY, TOTAL: Hep B Core Total Ab: NONREACTIVE

## 2024-04-18 LAB — MEASLES/MUMPS/RUBELLA IMMUNITY
Mumps IgG: 115 [AU]/ml
Rubella: 3.57 {index}
Rubeola IgG: 73 [AU]/ml

## 2024-04-18 LAB — HIV-1/2 AB - DIFFERENTIATION
HIV-1 antibody: POSITIVE — AB
HIV-2 Ab: NEGATIVE

## 2024-04-18 LAB — HEPATITIS B SURFACE ANTIBODY, QUANTITATIVE: Hep B S AB Quant (Post): <5 m[IU]/mL — ABNORMAL LOW (ref >=10)

## 2024-04-18 LAB — RPR: RPR Ser Ql: NONREACTIVE

## 2024-04-18 LAB — HEPATITIS C ANTIBODY: Hepatitis C Ab: NONREACTIVE

## 2024-04-18 LAB — HEPATITIS B SURFACE ANTIGEN: Hepatitis B Surface Ag: NONREACTIVE

## 2024-04-18 LAB — TOXOPLASMA GONDII ANTIBODY, IGG: Toxoplasma IgG Ratio: <7.2 [IU]/mL

## 2024-04-18 LAB — VARICELLA ZOSTER ANTIBODY, IGG: Varicella IgG: <1 {s_co_ratio} — ABNORMAL LOW

## 2024-04-18 LAB — HIV ANTIBODY (ROUTINE TESTING W REFLEX): HIV 1&2 Ab, 4th Generation: REACTIVE — AB

## 2024-05-02 ENCOUNTER — Other Ambulatory Visit (HOSPITAL_COMMUNITY): Payer: Self-pay

## 2024-05-03 ENCOUNTER — Other Ambulatory Visit (HOSPITAL_COMMUNITY): Payer: Self-pay

## 2024-05-10 NOTE — Progress Notes (Deleted)
   HPI: Steven [name] is a 23 y.o. male who presents to the [facility] pharmacy clinic for HIV follow-up.  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   New onset of headaches 09/28/2023   Human immunodeficiency virus (HIV) disease (HCC) 01/23/2020   Seizures (HCC) 08/03/2018   Major depressive disorder, single episode, severe without psychotic features (HCC)    Generalized anxiety disorder    Suicidal ideation    Parent-child relationship problem    History of fainting spells of unknown cause 02/01/2013   Unspecified convulsions (HCC) 01/31/2013   Seizure (HCC) 01/31/2013   Seizure disorder (HCC) 01/30/2013   Headache 01/30/2013    Patient's Medications  New Prescriptions   No medications on file  Previous Medications   BICTEGRAVIR-EMTRICITABINE-TENOFOVIR AF (BIKTARVY) 50-200-25 MG TABS TABLET    Take 1 tablet by mouth daily.   FERROUS SULFATE (IRON PO)    Take 1 tablet by mouth daily.   MAGNESIUM PO    Take 1 tablet by mouth daily.   POTASSIUM PO    Take 1 tablet by mouth daily. OTC   TIZANIDINE  (ZANAFLEX ) 4 MG TABLET    Take 1 tablet (4 mg total) by mouth every 8 (eight) hours as needed for muscle spasms.  Modified Medications   No medications on file  Discontinued Medications   No medications on file    Labs: Lab Results  Component Value Date   HIV1RNAQUANT NOT DETECTED 04/14/2024    RPR and STI Lab Results  Component Value Date   LABRPR NON-REACTIVE 04/14/2024   LABRPR Non Reactive 09/10/2023   LABRPR NON REACTIVE 07/02/2023   LABRPR NON REACTIVE 01/10/2023   LABRPR Reactive (A) 12/10/2021    STI Results GC CT  04/14/2024  9:34 AM Negative  Negative   09/28/2023  2:57 PM Negative    Negative  Negative    Negative   09/10/2023  1:50 PM Negative  Negative   07/02/2023  4:00 PM Negative  Negative   07/02/2023  2:57 PM  Negative  Negative   01/10/2023  8:17 AM Negative  Negative   09/17/2022 11:22 AM Negative  Negative   08/27/2022 10:40 PM Negative  Negative   05/28/2022  4:40 PM Positive    Positive  Negative    Positive   12/10/2021 12:15 PM Negative  Negative   03/06/2021 12:26 AM Positive  Negative   10/26/2015 12:00 AM Negative  Negative     Hepatitis B Lab Results  Component Value Date   HEPBSAG NON-REACTIVE 04/14/2024   HEPBCAB NON-REACTIVE 04/14/2024   Hepatitis C Lab Results  Component Value Date   HEPCAB NON-REACTIVE 04/14/2024   Hepatitis A Lab Results  Component Value Date   HAV NON-REACTIVE 04/14/2024   Lipids: Lab Results  Component Value Date   CHOL 244 (H) 04/14/2024   TRIG 173 (H) 04/14/2024   HDL 45 04/14/2024   CHOLHDL 5.4 (H) 04/14/2024   LDLCALC 166 (H) 04/14/2024    Current HIV Regimen: Biktarvy PO daily  Assessment: ***  Plan: -Check HIV RNA  Elma Fail, PharmD PGY1 Clinical Pharmacist [facility]  05/10/2024 4:03 PM

## 2024-05-11 ENCOUNTER — Encounter: Payer: Self-pay | Admitting: Family

## 2024-05-11 ENCOUNTER — Ambulatory Visit: Admitting: Pharmacist

## 2024-05-11 ENCOUNTER — Other Ambulatory Visit: Payer: Self-pay

## 2024-05-11 ENCOUNTER — Ambulatory Visit: Admitting: Family

## 2024-05-11 VITALS — BP 130/82 | HR 73 | Temp 97.8°F | Ht 71.0 in | Wt 253.0 lb

## 2024-05-11 DIAGNOSIS — Z113 Encounter for screening for infections with a predominantly sexual mode of transmission: Secondary | ICD-10-CM

## 2024-05-11 DIAGNOSIS — K648 Other hemorrhoids: Secondary | ICD-10-CM

## 2024-05-11 DIAGNOSIS — F411 Generalized anxiety disorder: Secondary | ICD-10-CM

## 2024-05-11 DIAGNOSIS — R1012 Left upper quadrant pain: Secondary | ICD-10-CM | POA: Diagnosis not present

## 2024-05-11 DIAGNOSIS — B20 Human immunodeficiency virus [HIV] disease: Secondary | ICD-10-CM

## 2024-05-11 MED ORDER — ESCITALOPRAM OXALATE 10 MG PO TABS: 10.0000 mg | ORAL_TABLET | Freq: Every day | ORAL | 2 refills | Status: DC

## 2024-05-11 MED ORDER — TRAZODONE HCL 50 MG PO TABS: 25.0000 mg | ORAL_TABLET | Freq: Every evening | ORAL | 1 refills | Status: DC | PRN

## 2024-05-11 MED ORDER — BICTEGRAVIR-EMTRICITAB-TENOFOV 50-200-25 MG PO TABS: 1.0000 | ORAL_TABLET | Freq: Every day | ORAL | 3 refills | Status: DC

## 2024-05-11 NOTE — Assessment & Plan Note (Signed)
 Mr. Steven [name] has internal hemorrhoid that is flaring resulting in current bright red blood per rectum and burning/itching following bowel movements.  Treat conservatively with over-the-counter Preparation H suppositories and discussed importance of anal hygiene and avoiding excessive wiping.  Encouraged to use moisturized wipes and consider sitz bath or bidet to help with cleansing the rectal area.  May need to consider surgery for hemorrhoid removal.

## 2024-05-11 NOTE — Progress Notes (Signed)
 Brief Narrative   Patient ID: Steven [name], male    DOB: 04/18/2001, 23 y.o.   MRN: [id number]  Steven [name] is a 22 y/o AA male diagnosed with HIV 1 disease in March 2021 with risk factor of MSM.SABRA Initial viral load was 684 with CD4 count 520. No genotype available. Toxoplasma IgG negative. No history of opportunistic infection. ART experienced with Biktarvy.   Subjective:   Chief Complaint  Patient presents with   New Patient (Initial Visit)    B20    HPI:  Steven [name] is a 23 y.o. male with with previous medical history of seizures, generalized anxiety disorder, HIV disease, and depression presenting today to transfer care for HIV disease.  Steven [name] most recent lab work completed on 04/14/2024 with viral load that is undetectable and CD4 count 797.  Kidney function, liver function, electrolytes within normal ranges.  Lipid profile with triglycerides 173, LDL 166, and HDL 45.  Negative for STDs including gonorrhea, chlamydia, and syphilis.  Not immune to hepatitis A or hepatitis B.  No infection with hepatitis B or hepatitis C.  HIV-1 antibody positive.  Toxoplasma negative.  QuantiFERON gold negative.  Steven [name] was initially diagnosed with HIV in March 2021 with risk factor of MSM.  Initial viral load was 684 with CD4 count 520.  No genotype available.  Toxoplasma IgG was negative.  No history of opportunistic infection.  ART experience with Biktarvy.  Has been off medication for a couple of months. Covered by [facility]. Has several concerns today including flare up of internal hemorrhoids with burning/itching following bowel movements and has increased anxiety about making sure he is clean following a bowel movement. Has noticed bright red blood after bowel movements and wiping. Has not attempted any treatments. Second is left upper abdominal pain that has been going on for about a year and waxes and wanes. Worsens when he holds his urine and improved after urinating.  Not affected by food intake. Worsened after sitting for for periods of time. Described as dull and achy. No trauma or injury. Drives a fork lift at work. 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 Thinks about the worst case scenario in every situation such as death from motor vehicle collision when driving. Has not been on medication in the past. Not sleeping well. Has spoken to a counselor at work and looking for another counselor to continue care. Healthcare maintenance reviewed. Condoms and site specific STD testing offered.   Denies fevers, chills, night sweats, headaches, changes in vision, neck pain/stiffness, nausea, diarrhea, vomiting, lesions or rashes.  Lab Results  Component Value Date   CD4TCELL 38 04/14/2024   Lab Results  Component Value Date   HIV1RNAQUANT NOT DETECTED 04/14/2024     No Known Allergies    Outpatient Medications Prior to Visit  Medication Sig Dispense Refill   Ferrous Sulfate (IRON PO) Take 1 tablet by mouth daily.     MAGNESIUM PO Take 1 tablet by mouth daily.     POTASSIUM PO Take 1 tablet by mouth daily. OTC     tiZANidine  (ZANAFLEX ) 4 MG tablet Take 1 tablet (4 mg total) by mouth every 8 (eight) hours as needed for muscle spasms. 30 tablet 0   bictegravir-emtricitabine-tenofovir AF (BIKTARVY) 50-200-25 MG TABS tablet Take 1 tablet by mouth daily.     No facility-administered medications prior to visit.  Past Medical History:  Diagnosis Date   Anxiety    Generalized headaches    GERD (gastroesophageal reflux disease)    History of fainting spells of unknown cause 02/01/2013   HIV (human immunodeficiency virus infection) (HCC)    Seizures (HCC)    last seizure 2 weeks ago   Syncope      Past Surgical History:  Procedure Laterality Date   CIRCUMCISION  2001        Review of Systems  Constitutional:  Negative for appetite change,  chills, fatigue, fever and unexpected weight change.  Eyes:  Negative for visual disturbance.  Respiratory:  Negative for cough, chest tightness, shortness of breath and wheezing.   Cardiovascular:  Negative for chest pain and leg swelling.  Gastrointestinal:  Positive for abdominal pain and anal bleeding. Negative for constipation, diarrhea, nausea and vomiting.  Genitourinary:  Negative for dysuria, flank pain, frequency, genital sores, hematuria and urgency.  Skin:  Negative for rash.  Allergic/Immunologic: Negative for immunocompromised state.  Neurological:  Negative for dizziness and headaches.  Psychiatric/Behavioral:  The patient is nervous/anxious.      Objective:   BP 130/82   Pulse 73   Temp 97.8 F (36.6 C) (Temporal)   Ht 5' 11 (1.803 m)   Wt 253 lb (114.8 kg)   SpO2 96%   BMI 35.29 kg/m  Nursing note and vital signs reviewed.  Physical Exam Constitutional:      General: He is not in acute distress.    Appearance: He is well-developed.  Eyes:     Conjunctiva/sclera: Conjunctivae normal.  Cardiovascular:     Rate and Rhythm: Normal rate and regular rhythm.     Heart sounds: Normal heart sounds. No murmur heard.    No friction rub. No gallop.  Pulmonary:     Effort: Pulmonary effort is normal. No respiratory distress.     Breath sounds: Normal breath sounds. No wheezing or rales.  Chest:     Chest wall: No tenderness.  Abdominal:     General: Bowel sounds are normal.     Palpations: Abdomen is soft.     Tenderness: There is no abdominal tenderness.  Musculoskeletal:     Cervical back: Neck supple.  Lymphadenopathy:     Cervical: No cervical adenopathy.  Skin:    General: Skin is warm and dry.     Findings: No rash.  Neurological:     Mental Status: He is alert and oriented to person, place, and time.  Psychiatric:        Mood and Affect: Mood is anxious.          05/11/2024    2:11 PM  Depression screen PHQ 2/9  Decreased Interest 0  Down,  Depressed, Hopeless 3  PHQ - 2 Score 3  Altered sleeping 3  Tired, decreased energy 2  Change in appetite 2  Feeling bad or failure about yourself  3  Trouble concentrating 3  Moving slowly or fidgety/restless 1  Suicidal thoughts 0  PHQ-9 Score 17        05/11/2024    2:12 PM  GAD 7 : Generalized Anxiety Score  Nervous, Anxious, on Edge 3  Control/stop worrying 3  Worry too much - different things 3  Trouble relaxing 3  Restless 1  Easily annoyed or irritable 3  Afraid - awful might happen 3  Total GAD 7 Score 19  Anxiety Difficulty Somewhat difficult     The ASCVD Risk score (Arnett DK, et al.,  2019) failed to calculate for the following reasons:   The 2019 ASCVD risk score is only valid for ages 44 to 87      Assessment & Plan:    Patient Active Problem List   Diagnosis Date Noted   Left upper quadrant pain 05/11/2024   Internal hemorrhoid 05/11/2024   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   New onset of headaches 09/28/2023   Human immunodeficiency virus (HIV) disease (HCC) 01/23/2020   Seizures (HCC) 08/03/2018   Major depressive disorder, single episode, severe without psychotic features (HCC)    Generalized anxiety disorder    Suicidal ideation    Parent-child relationship problem    History of fainting spells of unknown cause 02/01/2013   Unspecified convulsions (HCC) 01/31/2013   Seizure (HCC) 01/31/2013   Seizure disorder (HCC) 01/30/2013   Headache 01/30/2013     Problem List Items Addressed This Visit       Cardiovascular and Mediastinum   Internal hemorrhoid   Mr. Blough has internal hemorrhoid that is flaring resulting in current bright red blood per rectum and burning/itching following bowel movements.  Treat conservatively with over-the-counter Preparation H suppositories and discussed importance of anal hygiene and avoiding excessive wiping.   Encouraged to use moisturized wipes and consider sitz bath or bidet to help with cleansing the rectal area.  May need to consider surgery for hemorrhoid removal.        Other   Generalized anxiety disorder   Mr. Colina has generalized anxiety disorder that is poorly controlled and is multifactorial.  Lack of sleep is exacerbating his current symptoms.  Start trazodone  for sleep and Lexapro  for anxiety.  Discussed it is likely to take 4 to 6 weeks for Lexapro  to have therapeutic effects.  Continue counseling to help with coping mechanisms.  Plan for follow-up in 1 month or sooner if needed.      Relevant Medications   escitalopram  (LEXAPRO ) 10 MG tablet   traZODone  (DESYREL ) 50 MG tablet   Human immunodeficiency virus (HIV) disease (HCC) - Primary   Mr. Dirico has well-controlled virus and good tolerance to Biktarvy despite less than optimal adherence.  Discussed importance of taking medication daily as prescribed to reduce risk of disease progression and complications in the future.  Co-pay card provided as he is covered by [facility].  Discussed recent lab work, plan of care, and U equals U.  Social determinants of health reviewed with no interventions indicated.  Continue current dose of Biktarvy.  Plan for follow-up in 1 month or sooner if needed with lab work on the same day.      Relevant Medications   bictegravir-emtricitabine-tenofovir AF (BIKTARVY) 50-200-25 MG TABS tablet   Left upper quadrant pain   Chronic left upper abdominal pain of unclear origin with possibility of muscle skeletal origin based on history although cannot rule out underlying pathology.  Obtain ultrasound.  Continue symptomatic management as needed pending ultrasound results.      Relevant Orders   US  Abdomen Complete     I am having Macio Dunkel start on escitalopram  and traZODone . I am also having him maintain his Ferrous Sulfate (IRON PO), POTASSIUM PO, MAGNESIUM PO, tiZANidine , and  bictegravir-emtricitabine-tenofovir AF.   Meds ordered this encounter  Medications   escitalopram  (LEXAPRO ) 10 MG tablet    Sig: Take 1 tablet (10 mg total) by mouth daily.    Dispense:  30 tablet    Refill:  2    Supervising Provider:   LUIZ CHANNEL [phone]   traZODone  (DESYREL ) 50 MG tablet    Sig: Take 0.5-1 tablets (25-50 mg total) by mouth at bedtime as needed for sleep.    Dispense:  30 tablet    Refill:  1    Supervising Provider:   SNIDER, CYNTHIA [[id number]]   bictegravir-emtricitabine-tenofovir AF (BIKTARVY) 50-200-25 MG TABS tablet    Sig: Take 1 tablet by mouth daily.    Dispense:  30 tablet    Refill:  3    Supervising Provider:   LUIZ CHANNEL [phone]     I have personally spent 50 minutes involved in face-to-face and non-face-to-face activities for this patient on the day of the visit. Professional time spent includes the following activities: preparing to see the patient (review of tests), obtaining and reviewing separately obtained history (previous HIV records in Care everywhere), performing a medically appropriate examination, ordering medications, documenting clinical information in the EMR, communicating results and counseling patient and partner regarding medication and plan of care, and care coordination.    Follow-up: Return in about 1 month (around 06/11/2024). or sooner if needed.    Cathlyn July, MSN, FNP-C Nurse Practitioner [facility] for Infectious Disease [facility] Main number: [phone]

## 2024-05-11 NOTE — Patient Instructions (Signed)
Nice to see you.  We will check your lab work today.  Continue to take your medication daily as prescribed.  Refills have been sent to the pharmacy.  Plan for follow up in 1 months or sooner if needed with lab work on the same day.  Have a great day and stay safe!  

## 2024-05-11 NOTE — Assessment & Plan Note (Signed)
 Chronic left upper abdominal pain of unclear origin with possibility of muscle skeletal origin based on history although cannot rule out underlying pathology.  Obtain ultrasound.  Continue symptomatic management as needed pending ultrasound results.

## 2024-05-11 NOTE — Assessment & Plan Note (Signed)
 Steven [name] has generalized anxiety disorder that is poorly controlled and is multifactorial.  Lack of sleep is exacerbating his current symptoms.  Start trazodone  for sleep and Lexapro  for anxiety.  Discussed it is likely to take 4 to 6 weeks for Lexapro  to have therapeutic effects.  Continue counseling to help with coping mechanisms.  Plan for follow-up in 1 month or sooner if needed.

## 2024-05-11 NOTE — Assessment & Plan Note (Signed)
 Steven [name] has well-controlled virus and good tolerance to Biktarvy despite less than optimal adherence.  Discussed importance of taking medication daily as prescribed to reduce risk of disease progression and complications in the future.  Co-pay card provided as he is covered by [facility].  Discussed recent lab work, plan of care, and U equals U.  Social determinants of health reviewed with no interventions indicated.  Continue current dose of Biktarvy.  Plan for follow-up in 1 month or sooner if needed with lab work on the same day.

## 2024-05-12 ENCOUNTER — Encounter: Payer: Self-pay | Admitting: Family

## 2024-05-20 DIAGNOSIS — S0181XA Laceration without foreign body of other part of head, initial encounter: Secondary | ICD-10-CM | POA: Diagnosis not present

## 2024-05-20 DIAGNOSIS — S0083XA Contusion of other part of head, initial encounter: Secondary | ICD-10-CM | POA: Diagnosis not present

## 2024-05-20 DIAGNOSIS — S0990XA Unspecified injury of head, initial encounter: Secondary | ICD-10-CM | POA: Diagnosis not present

## 2024-05-20 DIAGNOSIS — Z23 Encounter for immunization: Secondary | ICD-10-CM | POA: Diagnosis not present

## 2024-05-31 ENCOUNTER — Other Ambulatory Visit

## 2024-06-01 ENCOUNTER — Encounter: Payer: Self-pay | Admitting: Family

## 2024-06-01 ENCOUNTER — Other Ambulatory Visit: Payer: Self-pay

## 2024-06-01 ENCOUNTER — Ambulatory Visit (INDEPENDENT_AMBULATORY_CARE_PROVIDER_SITE_OTHER): Admitting: Family

## 2024-06-01 ENCOUNTER — Other Ambulatory Visit (HOSPITAL_COMMUNITY)
Admission: RE | Admit: 2024-06-01 | Discharge: 2024-06-01 | Disposition: A | Discharge status: Home / Self Care | Source: Ambulatory Visit | Attending: Family | Admitting: Family

## 2024-06-01 VITALS — BP 121/80 | HR 76 | Temp 98.5°F | Ht 71.0 in | Wt 246.0 lb

## 2024-06-01 DIAGNOSIS — Z113 Encounter for screening for infections with a predominantly sexual mode of transmission: Secondary | ICD-10-CM | POA: Insufficient documentation

## 2024-06-01 DIAGNOSIS — Z23 Encounter for immunization: Secondary | ICD-10-CM | POA: Diagnosis not present

## 2024-06-01 DIAGNOSIS — F411 Generalized anxiety disorder: Secondary | ICD-10-CM | POA: Diagnosis not present

## 2024-06-01 DIAGNOSIS — B20 Human immunodeficiency virus [HIV] disease: Secondary | ICD-10-CM | POA: Diagnosis not present

## 2024-06-01 DIAGNOSIS — Z0000 Encounter for general adult medical examination without abnormal findings: Secondary | ICD-10-CM

## 2024-06-01 MED ORDER — BICTEGRAVIR-EMTRICITAB-TENOFOV 50-200-25 MG PO TABS: 1.0000 | ORAL_TABLET | Freq: Every day | ORAL | 3 refills | Status: DC

## 2024-06-01 MED ORDER — SERTRALINE HCL 25 MG PO TABS: 25.0000 mg | ORAL_TABLET | Freq: Every day | ORAL | 1 refills | Status: AC

## 2024-06-01 NOTE — Assessment & Plan Note (Signed)
 Discussed importance of safe sexual practice and condom use. Condoms and site specific STD testing offered.  Vaccinations reviewed and infuenza updated following counseling. Due for dental care with referral placed to [facility].

## 2024-06-01 NOTE — Assessment & Plan Note (Addendum)
 Mr. Steven [name] continues to have anxiety although improved from previous office visit with intolerance to Lexapro  secondary to nausea.  Discussed role of psychotherapy to help with his symptoms.  Following discussion we will start sertraline .  Counseled to seek emergency care if thoughts of suicide develop.

## 2024-06-01 NOTE — Assessment & Plan Note (Signed)
 Mr. Masterson continues to have well-controlled virus with good adherence and tolerance to Biktarvy.  Reviewed previous lab work and discussed plan of care and U equals U.  No problems obtaining medication from the pharmacy and covered by [facility].  Social determinants of health reviewed with no interventions indicated.  Check blood work.  Continue current dose of Biktarvy.  Plan for follow-up in 3 months or sooner if needed with lab work on the same day.

## 2024-06-01 NOTE — Progress Notes (Unsigned)
 Brief Narrative   Patient ID: Steven [name], male    DOB: 2001-08-18, 23 y.o.   MRN: [id number]  Steven [name] is a 23 y/o AA male diagnosed with HIV 1 disease in March 2021 with risk factor of MSM.SABRA Initial viral load was 684 with CD4 count 520. No genotype available. Toxoplasma IgG negative. No history of opportunistic infection. ART experienced with Biktarvy.   Subjective:   Chief Complaint  Patient presents with   Follow-up    B20    HPI:  Steven [name] is a 23 y.o. male with HIV disease last seen on 05/11/2024 with well-controlled virus and good adherence and tolerance to [employer].  Viral load was undetectable with CD4 count 797.  Kidney function and electrolytes within normal ranges.  ALT slightly elevated at 48.  Was started on Lexapro  for anxiety and trazodone  for sleep.  Here today for routine follow-up.  Steven [name] has been doing well since his last office visit and continues to take [facility] as prescribed with no adverse side effects or problems obtaining medication from pharmacy.  Covered by [facility].  Started taking the Lexapro  and noted to have nausea and overall not feeling well and stopped take the medication about 1 week ago.  Trazodone  is working effectively and sleeping about 6 to 8 hours nightly without adverse side effects.  Feeling better rested in the mornings.  Housing, transportation, and access to food are stable.  Sexually active and condoms and site-specific STD testing offered.  Due for routine dental care.  Healthcare maintenance reviewed.  Denies fevers, chills, night sweats, headaches, changes in vision, neck pain/stiffness, nausea, diarrhea, vomiting, lesions or rashes.  Lab Results  Component Value Date   CD4TCELL 38 04/14/2024   Lab Results  Component Value Date   HIV1RNAQUANT NOT DETECTED 04/14/2024     No Known Allergies    Outpatient Medications Prior to Visit  Medication Sig Dispense Refill   Ferrous Sulfate (IRON PO) Take 1  tablet by mouth daily.     MAGNESIUM PO Take 1 tablet by mouth daily.     POTASSIUM PO Take 1 tablet by mouth daily. OTC     tiZANidine  (ZANAFLEX ) 4 MG tablet Take 1 tablet (4 mg total) by mouth every 8 (eight) hours as needed for muscle spasms. 30 tablet 0   traZODone  (DESYREL ) 50 MG tablet Take 0.5-1 tablets (25-50 mg total) by mouth at bedtime as needed for sleep. 30 tablet 1   bictegravir-emtricitabine-tenofovir AF (BIKTARVY) 50-200-25 MG TABS tablet Take 1 tablet by mouth daily. 30 tablet 3   escitalopram  (LEXAPRO ) 10 MG tablet Take 1 tablet (10 mg total) by mouth daily. 30 tablet 2   No facility-administered medications prior to visit.     Past Medical History:  Diagnosis Date   Anxiety    Generalized headaches    GERD (gastroesophageal reflux disease)    History of fainting spells of unknown cause 02/01/2013   HIV (human immunodeficiency virus infection) (HCC)    Seizures (HCC)    last seizure 2 weeks ago   Syncope      Past Surgical History:  Procedure Laterality Date   CIRCUMCISION  2001        Review of Systems  Constitutional:  Negative for appetite change, chills, fatigue, fever and unexpected weight change.  Eyes:  Negative for visual disturbance.  Respiratory:  Negative for cough, chest tightness, shortness of breath and wheezing.   Cardiovascular:  Negative for chest pain and leg swelling.  Gastrointestinal:  Negative for abdominal pain, constipation, diarrhea, nausea and vomiting.  Genitourinary:  Negative for dysuria, flank pain, frequency, genital sores, hematuria and urgency.  Skin:  Negative for rash.  Allergic/Immunologic: Negative for immunocompromised state.  Neurological:  Negative for dizziness and headaches.     Objective:   BP 121/80   Pulse 76   Temp 98.5 F (36.9 C) (Oral)   Ht 5' 11 (1.803 m)   Wt 246 lb (111.6 kg)   SpO2 97%   BMI 34.31 kg/m  Nursing note and vital signs reviewed.  Physical Exam Constitutional:      General:  He is not in acute distress.    Appearance: He is well-developed.  Eyes:     Conjunctiva/sclera: Conjunctivae normal.  Cardiovascular:     Rate and Rhythm: Normal rate and regular rhythm.     Heart sounds: Normal heart sounds. No murmur heard.    No friction rub. No gallop.  Pulmonary:     Effort: Pulmonary effort is normal. No respiratory distress.     Breath sounds: Normal breath sounds. No wheezing or rales.  Chest:     Chest wall: No tenderness.  Abdominal:     General: Bowel sounds are normal.     Palpations: Abdomen is soft.     Tenderness: There is no abdominal tenderness.  Musculoskeletal:     Cervical back: Neck supple.  Lymphadenopathy:     Cervical: No cervical adenopathy.  Skin:    General: Skin is warm and dry.     Findings: No rash.  Neurological:     Mental Status: He is alert and oriented to person, place, and time.  Psychiatric:        Behavior: Behavior normal.        Thought Content: Thought content normal.        Judgment: Judgment normal.          06/01/2024    2:11 PM 05/11/2024    2:11 PM  Depression screen PHQ 2/9  Decreased Interest 0 0  Down, Depressed, Hopeless 0 3  PHQ - 2 Score 0 3  Altered sleeping 0 3  Tired, decreased energy 0 2  Change in appetite 0 2  Feeling bad or failure about yourself  0 3  Trouble concentrating 0 3  Moving slowly or fidgety/restless 0 1  Suicidal thoughts 0 0  PHQ-9 Score 0 17  Difficult doing work/chores Not difficult at all         06/01/2024    2:12 PM 05/11/2024    2:12 PM  GAD 7 : Generalized Anxiety Score  Nervous, Anxious, on Edge 0 3  Control/stop worrying 0 3  Worry too much - different things 0 3  Trouble relaxing 0 3  Restless 0 1  Easily annoyed or irritable 0 3  Afraid - awful might happen 0 3  Total GAD 7 Score 0 19  Anxiety Difficulty  Somewhat difficult     The ASCVD Risk score (Arnett DK, et al., 2019) failed to calculate for the following reasons:   The 2019 ASCVD risk score is  only valid for ages 37 to 12      Assessment & Plan:    Patient Active Problem List   Diagnosis Date Noted   Healthcare maintenance 06/01/2024   Left upper quadrant pain 05/11/2024   Internal hemorrhoid 05/11/2024   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   New onset  of headaches 09/28/2023   Human immunodeficiency virus (HIV) disease (HCC) 01/23/2020   Seizures (HCC) 08/03/2018   Major depressive disorder, single episode, severe without psychotic features (HCC)    Generalized anxiety disorder    Suicidal ideation    Parent-child relationship problem    History of fainting spells of unknown cause 02/01/2013   Unspecified convulsions (HCC) 01/31/2013   Seizure (HCC) 01/31/2013   Seizure disorder (HCC) 01/30/2013   Headache 01/30/2013     Problem List Items Addressed This Visit       Other   Generalized anxiety disorder   Steven [name] continues to have anxiety although improved from previous office visit with intolerance to Lexapro  secondary to nausea.  Discussed role of psychotherapy to help with his symptoms.  Following discussion we will start sertraline .  Counseled to seek emergency care if thoughts of suicide develop.      Relevant Medications   sertraline  (ZOLOFT ) 25 MG tablet   Human immunodeficiency virus (HIV) disease (HCC) - Primary   Steven [name] continues to have well-controlled virus with good adherence and tolerance to Biktarvy.  Reviewed previous lab work and discussed plan of care and U equals U.  No problems obtaining medication from the pharmacy and covered by [facility].  Social determinants of health reviewed with no interventions indicated.  Check blood work.  Continue current dose of Biktarvy.  Plan for follow-up in 3 months or sooner if needed with lab work on the same day.      Relevant Medications   bictegravir-emtricitabine-tenofovir AF (BIKTARVY)  50-200-25 MG TABS tablet   Other Relevant Orders   Comprehensive metabolic panel with GFR   HIV-1 RNA quant-no reflex-bld   T-helper cell (CD4)- ([facility] clinic only)   Healthcare maintenance   Discussed importance of safe sexual practice and condom use. Condoms and site specific STD testing offered.  Vaccinations reviewed and infuenza updated following counseling. Due for dental care with referral placed to [facility].       Other Visit Diagnoses       Screening for STDs (sexually transmitted diseases)       Relevant Orders   RPR   Urine cytology ancillary only   Cytology (oral, anal, urethral) ancillary only   Cytology (oral, anal, urethral) ancillary only        I have discontinued Steven [name]'s escitalopram . I am also having him start on sertraline . Additionally, I am having him maintain his Ferrous Sulfate (IRON PO), POTASSIUM PO, MAGNESIUM PO, tiZANidine , traZODone , and bictegravir-emtricitabine-tenofovir AF.   Meds ordered this encounter  Medications   sertraline  (ZOLOFT ) 25 MG tablet    Sig: Take 1 tablet (25 mg total) by mouth daily.    Dispense:  30 tablet    Refill:  1    Supervising Provider:   SNIDER, CYNTHIA [[id number]]   bictegravir-emtricitabine-tenofovir AF (BIKTARVY) 50-200-25 MG TABS tablet    Sig: Take 1 tablet by mouth daily.    Dispense:  30 tablet    Refill:  3    Supervising Provider:   LUIZ CHANNEL [[id number]]     Follow-up: Return in about 3 months (around 08/31/2024), or if symptoms worsen or fail to improve. or sooner if needed.    Cathlyn July, MSN, FNP-C Nurse Practitioner [facility] Main number: [phone]

## 2024-06-01 NOTE — Patient Instructions (Addendum)
 Nice to see you.  We will check your lab work today.  Continue to take your medication daily as prescribed.  Refills have been sent to the pharmacy.  Plan for follow up in 3 months or sooner if needed with lab work on the same day.  Have a great day and stay safe!

## 2024-06-02 LAB — URINE CYTOLOGY ANCILLARY ONLY
Chlamydia: NEGATIVE
Comment: NEGATIVE
Comment: NORMAL
Neisseria Gonorrhea: NEGATIVE

## 2024-06-02 LAB — CYTOLOGY, (ORAL, ANAL, URETHRAL) ANCILLARY ONLY
Chlamydia: NEGATIVE
Chlamydia: NEGATIVE
Comment: NEGATIVE
Comment: NEGATIVE
Comment: NORMAL
Comment: NORMAL
Neisseria Gonorrhea: NEGATIVE
Neisseria Gonorrhea: NEGATIVE

## 2024-06-02 LAB — T-HELPER CELL (CD4) - (RCID CLINIC ONLY)
CD4 % Helper T Cell: 41 % (ref 33–65)
CD4 T Cell Abs: 635 /uL (ref 400–1790)

## 2024-06-03 ENCOUNTER — Other Ambulatory Visit: Payer: Self-pay | Admitting: Family

## 2024-06-03 LAB — COMPREHENSIVE METABOLIC PANEL WITH GFR
AG Ratio: 1.7 (calc) (ref 1.0–2.5)
ALT: 40 U/L (ref 9–46)
AST: 21 U/L (ref 10–40)
Albumin: 4.7 g/dL (ref 3.6–5.1)
Alkaline phosphatase (APISO): 70 U/L (ref 36–130)
BUN: 9 mg/dL (ref 7–25)
CO2: 26 mmol/L (ref 20–32)
Calcium: 9.7 mg/dL (ref 8.6–10.3)
Chloride: 105 mmol/L (ref 98–110)
Creat: 0.89 mg/dL (ref 0.60–1.24)
Globulin: 2.8 g/dL (ref 1.9–3.7)
Glucose, Bld: 88 mg/dL (ref 65–99)
Potassium: 4.2 mmol/L (ref 3.5–5.3)
Sodium: 137 mmol/L (ref 135–146)
Total Bilirubin: 0.4 mg/dL (ref 0.2–1.2)
Total Protein: 7.5 g/dL (ref 6.1–8.1)
eGFR: 123 mL/min/1.73m2 (ref >=60)

## 2024-06-03 LAB — HIV-1 RNA QUANT-NO REFLEX-BLD
HIV 1 RNA Quant: NOT DETECTED {copies}/mL
HIV-1 RNA Quant, Log: NOT DETECTED {Log_copies}/mL

## 2024-06-03 LAB — RPR: RPR Ser Ql: NONREACTIVE

## 2024-06-05 ENCOUNTER — Ambulatory Visit: Payer: Self-pay | Admitting: Family

## 2024-06-23 ENCOUNTER — Encounter (HOSPITAL_BASED_OUTPATIENT_CLINIC_OR_DEPARTMENT_OTHER): Payer: Self-pay

## 2024-06-23 ENCOUNTER — Other Ambulatory Visit: Payer: Self-pay

## 2024-06-23 ENCOUNTER — Emergency Department (HOSPITAL_BASED_OUTPATIENT_CLINIC_OR_DEPARTMENT_OTHER)
Admission: EM | Admit: 2024-06-23 | Discharge: 2024-06-24 | Disposition: A | Discharge status: Home / Self Care | Attending: Emergency Medicine | Admitting: Emergency Medicine

## 2024-06-23 DIAGNOSIS — R1012 Left upper quadrant pain: Secondary | ICD-10-CM | POA: Insufficient documentation

## 2024-06-23 DIAGNOSIS — F1090 Alcohol use, unspecified, uncomplicated: Secondary | ICD-10-CM | POA: Insufficient documentation

## 2024-06-23 DIAGNOSIS — G8929 Other chronic pain: Secondary | ICD-10-CM

## 2024-06-23 DIAGNOSIS — R110 Nausea: Secondary | ICD-10-CM | POA: Diagnosis not present

## 2024-06-23 DIAGNOSIS — R1084 Generalized abdominal pain: Secondary | ICD-10-CM | POA: Diagnosis not present

## 2024-06-23 LAB — CBC WITH DIFFERENTIAL/PLATELET
Abs Immature Granulocytes: 0.03 K/uL (ref 0.00–0.07)
Basophils Absolute: 0.1 K/uL (ref 0.0–0.1)
Basophils Relative: 1 %
Eosinophils Absolute: 0 K/uL (ref 0.0–0.5)
Eosinophils Relative: 0 %
HCT: 44.8 % (ref 39.0–52.0)
Hemoglobin: 14.6 g/dL (ref 13.0–17.0)
Immature Granulocytes: 1 %
Lymphocytes Relative: 37 %
Lymphs Abs: 2.3 K/uL (ref 0.7–4.0)
MCH: 27.5 pg (ref 26.0–34.0)
MCHC: 32.6 g/dL (ref 30.0–36.0)
MCV: 84.4 fL (ref 80.0–100.0)
Monocytes Absolute: 0.5 K/uL (ref 0.1–1.0)
Monocytes Relative: 9 %
Neutro Abs: 3.3 K/uL (ref 1.7–7.7)
Neutrophils Relative %: 52 %
Platelets: 278 K/uL (ref 150–400)
RBC: 5.31 MIL/uL (ref 4.22–5.81)
RDW: 13.6 % (ref 11.5–15.5)
WBC: 6.2 K/uL (ref 4.0–10.5)
nRBC: 0 % (ref 0.0–0.2)

## 2024-06-23 LAB — COMPREHENSIVE METABOLIC PANEL WITH GFR
ALT: 70 U/L — ABNORMAL HIGH (ref 0–44)
AST: 37 U/L (ref 15–41)
Albumin: 4.6 g/dL (ref 3.5–5.0)
Alkaline Phosphatase: 70 U/L (ref 38–126)
Anion gap: 11 (ref 5–15)
BUN: 12 mg/dL (ref 6–20)
CO2: 26 mmol/L (ref 22–32)
Calcium: 9.1 mg/dL (ref 8.9–10.3)
Chloride: 102 mmol/L (ref 98–111)
Creatinine, Ser: 0.97 mg/dL (ref 0.61–1.24)
GFR, Estimated: >60 mL/min (ref >60)
Glucose, Bld: 98 mg/dL (ref 70–99)
Potassium: 3.9 mmol/L (ref 3.5–5.1)
Sodium: 139 mmol/L (ref 135–145)
Total Bilirubin: 0.3 mg/dL (ref 0.0–1.2)
Total Protein: 7.1 g/dL (ref 6.5–8.1)

## 2024-06-23 LAB — ETHANOL: Alcohol, Ethyl (B): <15 mg/dL (ref <15)

## 2024-06-23 LAB — LIPASE, BLOOD: Lipase: 11 U/L (ref 11–51)

## 2024-06-23 MED ORDER — DIPHENHYDRAMINE HCL 50 MG/ML IJ SOLN
12.5000 mg | Freq: Once | INTRAMUSCULAR | Status: AC
  Administered 2024-06-23: 12.5 mg via INTRAVENOUS
  Filled 2024-06-23: qty 1

## 2024-06-23 MED ORDER — ONDANSETRON HCL 4 MG/2ML IJ SOLN: 4.0000 mg | Freq: Once | INTRAMUSCULAR | Status: DC

## 2024-06-23 MED ORDER — METOCLOPRAMIDE HCL 10 MG PO TABS: 10.0000 mg | ORAL_TABLET | Freq: Four times a day (QID) | ORAL | 0 refills | Status: AC

## 2024-06-23 MED ORDER — METOCLOPRAMIDE HCL 5 MG/ML IJ SOLN
10.0000 mg | Freq: Once | INTRAMUSCULAR | Status: AC
  Administered 2024-06-23: 10 mg via INTRAVENOUS
  Filled 2024-06-23: qty 2

## 2024-06-23 NOTE — ED Provider Notes (Signed)
 [place] [facility] Provider Note   CSN: [id number] Arrival date & time: 06/23/24  2222     Patient presents with: Abdominal Pain   Ann Groeneveld is a 23 y.o. male.   Patient to ED by EMS for evaluation of LUQ abdominal pain. He reports this is a pain he has daily x 1 year, but feels tonight it is worse than before. He admits to alcohol use regularly. He has nausea without vomiting. He has taken Tylenol  for the pain with some relief in the past. No fever, chest pain. He is having normal bowel movements. No flank pain or pain that radiates into the back.   The history is provided by the patient. No language interpreter was used.  Abdominal Pain      Prior to Admission medications   Medication Sig Start Date End Date Taking? Authorizing Provider  metoCLOPramide  (REGLAN ) 10 MG tablet Take 1 tablet (10 mg total) by mouth every 6 (six) hours. 06/23/24  Yes Ashten Sarnowski, Margit, PA-C  bictegravir-emtricitabine-tenofovir AF (BIKTARVY) 50-200-25 MG TABS tablet Take 1 tablet by mouth daily. 06/01/24   Calone, Gregory D, FNP  Ferrous Sulfate (IRON PO) Take 1 tablet by mouth daily.    [provider]  MAGNESIUM PO Take 1 tablet by mouth daily.    [provider]  POTASSIUM PO Take 1 tablet by mouth daily. OTC    [provider]  sertraline  (ZOLOFT ) 25 MG tablet Take 1 tablet (25 mg total) by mouth daily. 06/01/24   Calone, Gregory D, FNP  tiZANidine  (ZANAFLEX ) 4 MG tablet Take 1 tablet (4 mg total) by mouth every 8 (eight) hours as needed for muscle spasms. 12/31/23   Piontek, Rocky, MD  traZODone  (DESYREL ) 50 MG tablet TAKE 0.5-1 TABLETS BY MOUTH AT BEDTIME AS NEEDED FOR SLEEP. 06/08/24   Calone, Gregory D, FNP    Allergies: Patient has no known allergies.    Review of Systems  Gastrointestinal:  Positive for abdominal pain.    Updated Vital Signs BP 126/74   Pulse 67   Temp 98.5 F (36.9 C) (Oral)   Resp 16   Ht 5' 11 (1.803 m)    Wt 108.9 kg   SpO2 98%   BMI 33.47 kg/m   Physical Exam Constitutional:      Appearance: He is well-developed.  HENT:     Head: Normocephalic.  Cardiovascular:     Rate and Rhythm: Normal rate and regular rhythm.     Heart sounds: No murmur heard. Pulmonary:     Effort: Pulmonary effort is normal.     Breath sounds: Normal breath sounds. No wheezing, rhonchi or rales.  Abdominal:     General: Bowel sounds are normal.     Palpations: Abdomen is soft.     Tenderness: There is abdominal tenderness (Mild LUQ tenderness to soft abdomen.) in the left lower quadrant. There is no guarding or rebound.  Musculoskeletal:        General: Normal range of motion.     Cervical back: Normal range of motion and neck supple.  Skin:    General: Skin is warm and dry.  Neurological:     General: No focal deficit present.     Mental Status: He is alert and oriented to person, place, and time.     (all labs ordered are listed, but only abnormal results are displayed) Labs Reviewed  COMPREHENSIVE METABOLIC PANEL WITH GFR - Abnormal; Notable for the following components:  Result Value   ALT 70 (*)    All other components within normal limits  CBC WITH DIFFERENTIAL/PLATELET  LIPASE, BLOOD  ETHANOL    EKG: None  Radiology: No results found.   Procedures   Medications Ordered in the ED  metoCLOPramide  (REGLAN ) injection 10 mg (10 mg Intravenous Given 06/23/24 2351)  diphenhydrAMINE  (BENADRYL ) injection 12.5 mg (12.5 mg Intravenous Given 06/23/24 2351)    Clinical Course as of 06/24/24 0001  Fri Jun 23, 2024  2357 Patient to ED with LUQ abdominal pain x 1 year. He is very well appearing. VSS. Labs normal, including lipase.   Chart reviewed. He has a pending LUQ US  ordered by his doctor. Do not feel additional imaging is needed tonight and he is encouraged to coordinate further evaluation and the US  with his primary care physician.  [SU]    Clinical Course User Index [SU]  Odell Balls, PA-C                                 Medical Decision Making Amount and/or Complexity of Data Reviewed Labs: ordered.  Risk Prescription drug management.        Final diagnoses:  Chronic abdominal pain    ED Discharge Orders          Ordered    metoCLOPramide  (REGLAN ) 10 MG tablet  Every 6 hours        06/23/24 2359               Odell Balls, PA-C 06/24/24 0001    Emil Share, DO 06/26/24 [id number]

## 2024-06-23 NOTE — ED Triage Notes (Signed)
 Patient BIB EMS from home with complaint of left upper abd pain. Patient reports pain ongoing for over a year. Patient reports that he had a shot of liquor tonight before the abd pain started. Patient states I just couldn't handel it tonight that's why I came Patient reports pain as dull.  8/10

## 2024-06-24 NOTE — Discharge Instructions (Signed)
 Please plan to see your doctor for further outpatient management of your left sided abdominal pain. You can take Reglan  for pain and nausea every 6 hours.

## 2024-08-01 ENCOUNTER — Other Ambulatory Visit: Payer: Self-pay | Admitting: Family

## 2024-08-01 ENCOUNTER — Ambulatory Visit

## 2024-08-19 ENCOUNTER — Ambulatory Visit
Admission: EM | Admit: 2024-08-19 | Discharge: 2024-08-19 | Disposition: A | Discharge status: Home / Self Care | Attending: Internal Medicine | Admitting: Internal Medicine

## 2024-08-19 DIAGNOSIS — J029 Acute pharyngitis, unspecified: Secondary | ICD-10-CM | POA: Diagnosis not present

## 2024-08-19 DIAGNOSIS — R21 Rash and other nonspecific skin eruption: Secondary | ICD-10-CM | POA: Insufficient documentation

## 2024-08-19 LAB — POCT RAPID STREP A (OFFICE): Rapid Strep A Screen: NEGATIVE

## 2024-08-19 MED ORDER — AMOXICILLIN 875 MG PO TABS: 875.0000 mg | ORAL_TABLET | Freq: Two times a day (BID) | ORAL | 0 refills | Status: AC

## 2024-08-19 MED ORDER — HYDROCORTISONE 2.5 % EX LOTN: TOPICAL_LOTION | Freq: Two times a day (BID) | CUTANEOUS | 0 refills | Status: AC

## 2024-08-19 NOTE — ED Provider Notes (Signed)
 [facility]    CSN: [id number] Arrival date & time: 08/19/24  1130      History   Chief Complaint Chief Complaint  Patient presents with   Sore Throat    HPI Steven [name] is a 23 y.o. male.   23 y.o. male who presents to urgent care with complaints of sore throat and trouble swallowing.  He reports his symptoms started yesterday.  They are no better today.  He denies any fevers, nausea, vomiting, headache, sick contacts.  The only other associated symptom is a mild rash between his legs.  He denies any rash on his hands or feet.   Sore Throat Pertinent negatives include no chest pain, no abdominal pain and no shortness of breath.    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   New onset of headaches 09/28/2023   Human immunodeficiency virus (HIV) disease (HCC) 01/23/2020   Human immunodeficiency virus (HIV) disease (HCC) 01/23/2020   Seizures (HCC) 08/03/2018   Major depressive disorder, single episode, severe without psychotic features (HCC)    Generalized anxiety disorder    Suicidal ideation    Parent-child relationship problem    History of fainting spells of unknown cause 02/01/2013   Unspecified convulsions (HCC) 01/31/2013   Seizure (HCC) 01/31/2013   Seizure disorder (HCC) 01/30/2013   Headache 01/30/2013    Past Surgical History:  Procedure Laterality Date   CIRCUMCISION  2001       Home Medications     Prior to Admission medications   Medication Sig Start Date End Date Taking? Authorizing Provider  amoxicillin  (AMOXIL ) 875 MG tablet Take 1 tablet (875 mg total) by mouth 2 (two) times daily for 10 days. 08/19/24 08/29/24 Yes Lyam Provencio A, PA-C  bictegravir-emtricitabine-tenofovir AF (BIKTARVY) 50-200-25 MG TABS tablet Take 1 tablet by mouth daily. 06/01/24  Yes Calone, Gregory D, FNP  hydrocortisone  2.5 % lotion Apply topically 2 (two) times daily. 08/19/24  Yes Shakenna Herrero A, PA-C  meloxicam (MOBIC) 15 MG tablet Take 15 mg by mouth daily. 12/20/20  Yes [provider]  Ferrous Sulfate (IRON PO) Take 1 tablet by mouth daily.    [provider]  MAGNESIUM PO Take 1 tablet by mouth daily.    [provider]  metoCLOPramide  (REGLAN ) 10 MG tablet Take 1 tablet (10 mg total) by mouth every 6 (six) hours. 06/23/24   Odell Balls, PA-C  POTASSIUM PO Take 1 tablet by mouth daily. OTC    [provider]  sertraline  (ZOLOFT ) 25 MG tablet Take 1 tablet (25 mg total) by mouth daily. 06/01/24   Calone, Gregory D, FNP  tiZANidine  (ZANAFLEX ) 4 MG tablet Take 1 tablet (4 mg total) by mouth every 8 (eight) hours as needed for muscle spasms. 12/31/23   Piontek, Rocky, MD  traZODone  (DESYREL ) 50 MG tablet TAKE 0.5-1 TABLETS BY MOUTH AT BEDTIME AS NEEDED FOR SLEEP. 06/08/24   Calone, Gregory D, FNP  Family History Family History  Problem Relation Age of Onset   Epilepsy Maternal Grandmother    Epilepsy Paternal Grandmother    Seizures Maternal Aunt    Seizures Cousin    Depression Sister     Social History Social History   Tobacco Use   Smoking status: Former    Types: Cigarettes    Passive exposure: Past   Smokeless tobacco: Never  Vaping Use   Vaping status: Never Used  Substance Use Topics   Alcohol use: Yes    Comment: Occassionally.   Drug use: Yes    Frequency: 1.0 times per week    Types: Marijuana     Allergies   Patient has no known  allergies.   Review of Systems Review of Systems  Constitutional:  Negative for chills and fever.  HENT:  Positive for sore throat and trouble swallowing. Negative for ear pain.   Eyes:  Negative for pain and visual disturbance.  Respiratory:  Negative for cough and shortness of breath.   Cardiovascular:  Negative for chest pain and palpitations.  Gastrointestinal:  Negative for abdominal pain and vomiting.  Genitourinary:  Negative for dysuria and hematuria.  Musculoskeletal:  Negative for arthralgias and back pain.  Skin:  Negative for color change and rash.  Neurological:  Negative for seizures and syncope.  All other systems reviewed and are negative.    Physical Exam Triage Vital Signs ED Triage Vitals  Encounter Vitals Group     BP 08/19/24 1150 117/75     Girls Systolic BP Percentile --      Girls Diastolic BP Percentile --      Boys Systolic BP Percentile --      Boys Diastolic BP Percentile --      Pulse Rate 08/19/24 1150 80     Resp 08/19/24 1150 18     Temp 08/19/24 1150 98.6 F (37 C)     Temp Source 08/19/24 1150 Oral     SpO2 08/19/24 1150 96 %     Weight 08/19/24 1148 253 lb (114.8 kg)     Height 08/19/24 1148 5' 11 (1.803 m)     Head Circumference --      Peak Flow --      Pain Score 08/19/24 1146 8     Pain Loc --      Pain Education --      Exclude from Growth Chart --    No data found.  Updated Vital Signs BP 117/75 (BP Location: Left Arm)   Pulse 80   Temp 98.6 F (37 C) (Oral)   Resp 18   Ht 5' 11 (1.803 m)   Wt 253 lb (114.8 kg)   SpO2 96%   BMI 35.29 kg/m   Visual Acuity Right Eye Distance:   Left Eye Distance:   Bilateral Distance:    Right Eye Near:   Left Eye Near:    Bilateral Near:     Physical Exam Vitals and nursing note reviewed.  Constitutional:      General: He is not in acute distress.    Appearance: He is well-developed.  HENT:     Head: Normocephalic and atraumatic.     Right Ear: Tympanic membrane normal.      Left Ear: Tympanic membrane normal.     Mouth/Throat:     Mouth: Mucous membranes are moist.     Pharynx: Pharyngeal swelling and posterior oropharyngeal erythema present.     Tonsils: 2+ on the right. 2+  on the left.  Eyes:     Conjunctiva/sclera: Conjunctivae normal.  Cardiovascular:     Rate and Rhythm: Normal rate and regular rhythm.     Heart sounds: No murmur heard. Pulmonary:     Effort: Pulmonary effort is normal. No respiratory distress.     Breath sounds: Normal breath sounds.  Abdominal:     Palpations: Abdomen is soft.     Tenderness: There is no abdominal tenderness.  Musculoskeletal:        General: No swelling.     Cervical back: Neck supple.  Skin:    General: Skin is warm and dry.     Capillary Refill: Capillary refill takes less than 2 seconds.  Neurological:     Mental Status: He is alert.  Psychiatric:        Mood and Affect: Mood normal.      UC Treatments / Results  Labs (all labs ordered are listed, but only abnormal results are displayed) Labs Reviewed  POCT RAPID STREP A (OFFICE) - Normal  CULTURE, GROUP A STREP [facility])    EKG   Radiology No results found.  Procedures Procedures (including critical care time)  Medications Ordered in UC Medications - No data to display  Initial Impression / Assessment and Plan / UC Course  I have reviewed the triage vital signs and the nursing notes.  Pertinent labs & imaging results that were available during my care of the patient were reviewed by me and considered in my medical decision making (see chart for details).     Acute pharyngitis, unspecified etiology - Plan: Culture, group A strep, Culture, group A strep  Sore throat - Plan: POCT rapid strep A, POCT rapid strep A, Culture, group A strep, Culture, group A strep  Rash and nonspecific skin eruption   Strep testing done today is negative however the appearance of the throat does appear to be more of a bacterial type infection.  We  will send the swab off for a culture and empirically treat for acute pharyngitis.  The area between the thighs has been ongoing and may be secondary to friction versus heat versus maybe a mild candida.  We will start with calling in a steroid cream to help with the itching.  If symptoms worsen then you need to follow-up at urgent care or with your primary care provider.  In regards to the chronic abdominal pain, we recommend contacting your primary care provider for further evaluation as you may need to see a gastroenterologist.  We have sent in the following medication: Amoxicillin  875 mg twice daily for 10 days.  This is an antibiotic.  Take this with food.  Hydrocortisone  cream twice daily to the face and neck as needed for itching/rash.  Make sure to stay hydrated by drinking plenty of water. Return to urgent care or PCP if symptoms worsen or fail to resolve.    Final Clinical Impressions(s) / UC Diagnoses   Final diagnoses:  Sore throat  Acute pharyngitis, unspecified etiology  Rash and nonspecific skin eruption     Discharge Instructions      Strep testing done today is negative however the appearance of the throat does appear to be more of a bacterial type infection.  We will send the swab off for a culture and empirically treat for acute pharyngitis.  The area between the thighs has been ongoing and may be secondary to friction versus heat versus maybe a mild candida.  We will start with calling in  a steroid cream to help with the itching.  If symptoms worsen then you need to follow-up at urgent care or with your primary care provider.  In regards to the chronic abdominal pain, we recommend contacting your primary care provider for further evaluation as you may need to see a gastroenterologist.  We have sent in the following medication: Amoxicillin  875 mg twice daily for 10 days.  This is an antibiotic.  Take this with food.  Hydrocortisone  cream twice daily to the face and neck as needed  for itching/rash.  Make sure to stay hydrated by drinking plenty of water. Return to urgent care or PCP if symptoms worsen or fail to resolve.       ED Prescriptions     Medication Sig Dispense Auth. Provider   hydrocortisone  2.5 % lotion Apply topically 2 (two) times daily. 59 mL Nashae Maudlin A, PA-C   amoxicillin  (AMOXIL ) 875 MG tablet Take 1 tablet (875 mg total) by mouth 2 (two) times daily for 10 days. 20 tablet Teresa Almarie LABOR, [place]      PDMP not reviewed this encounter.   Teresa Almarie LABOR, PA-C 08/19/24 1223

## 2024-08-19 NOTE — Discharge Instructions (Addendum)
 Strep testing done today is negative however the appearance of the throat does appear to be more of a bacterial type infection.  We will send the swab off for a culture and empirically treat for acute pharyngitis.  The area between the thighs has been ongoing and may be secondary to friction versus heat versus maybe a mild candida.  We will start with calling in a steroid cream to help with the itching.  If symptoms worsen then you need to follow-up at urgent care or with your primary care provider.  In regards to the chronic abdominal pain, we recommend contacting your primary care provider for further evaluation as you may need to see a gastroenterologist.  We have sent in the following medication: Amoxicillin  875 mg twice daily for 10 days.  This is an antibiotic.  Take this with food.  Hydrocortisone  cream twice daily to the face and neck as needed for itching/rash.  Make sure to stay hydrated by drinking plenty of water. Return to urgent care or PCP if symptoms worsen or fail to resolve.

## 2024-08-19 NOTE — ED Triage Notes (Signed)
 Patient reports sore throat that started hurting last night but no improvement as of this morning. No fever. Some new rash (in between thighs). No fever.

## 2024-08-20 LAB — CULTURE, GROUP A STREP (THRC)

## 2024-08-21 ENCOUNTER — Ambulatory Visit (HOSPITAL_COMMUNITY): Payer: Self-pay

## 2024-08-30 ENCOUNTER — Other Ambulatory Visit (HOSPITAL_COMMUNITY): Payer: Self-pay

## 2024-08-30 ENCOUNTER — Ambulatory Visit

## 2024-08-30 ENCOUNTER — Encounter: Payer: Self-pay | Admitting: Family

## 2024-08-30 ENCOUNTER — Other Ambulatory Visit (HOSPITAL_COMMUNITY)
Admission: RE | Admit: 2024-08-30 | Discharge: 2024-08-30 | Disposition: A | Source: Ambulatory Visit | Attending: Family | Admitting: Family

## 2024-08-30 ENCOUNTER — Other Ambulatory Visit: Payer: Self-pay

## 2024-08-30 ENCOUNTER — Ambulatory Visit: Admitting: Family

## 2024-08-30 VITALS — BP 115/79 | HR 74 | Temp 99.1°F | Resp 16

## 2024-08-30 DIAGNOSIS — Z113 Encounter for screening for infections with a predominantly sexual mode of transmission: Secondary | ICD-10-CM | POA: Diagnosis not present

## 2024-08-30 DIAGNOSIS — Z79899 Other long term (current) drug therapy: Secondary | ICD-10-CM | POA: Diagnosis not present

## 2024-08-30 DIAGNOSIS — Z23 Encounter for immunization: Secondary | ICD-10-CM

## 2024-08-30 DIAGNOSIS — B20 Human immunodeficiency virus [HIV] disease: Secondary | ICD-10-CM | POA: Diagnosis not present

## 2024-08-30 DIAGNOSIS — Z0000 Encounter for general adult medical examination without abnormal findings: Secondary | ICD-10-CM

## 2024-08-30 MED ORDER — BICTEGRAVIR-EMTRICITAB-TENOFOV 50-200-25 MG PO TABS: 1.0000 | ORAL_TABLET | Freq: Every day | ORAL | 5 refills | Status: AC

## 2024-08-30 NOTE — Patient Instructions (Signed)
 Nice to see you.  We will check your lab work today.  Continue to take your medication daily as prescribed.  Refills have been sent to the pharmacy.  Plan for follow up in 3 months or sooner if needed with lab work on the same day.  Have a great day and stay safe!

## 2024-08-30 NOTE — Progress Notes (Signed)
 Brief Narrative   Patient ID: Steven [name], male    DOB: 06-Mar-2001, 23 y.o.   MRN: [id number]  Mr. Abascal is a 23 y/o AA male diagnosed with HIV 1 disease in March 2021 with risk factor of MSM.SABRA Initial viral load was 684 with CD4 count 520. Entered care at [facility] Stage 1. No genotype available. Toxoplasma IgG negative. No history of opportunistic infection. ART experienced with Biktarvy.   Subjective:   Chief Complaint  Patient presents with   Follow-up    B20     HPI:  Steven [name] is a 23 y.o. male with HIV disease last seen on 06/01/24 with well-controlled virus and good adherence and tolerance to [employer].  Viral load was undetectable with CD4 count 635.  Kidney function, liver function, electrolytes within normal ranges.  RPR was nonreactive for syphilis.  Here today for routine follow-up.  Mr. Lamison been doing well since his last office visit and continues to take [facility] as prescribed with no adverse side effects or problems obtaining medication from the pharmacy.  Covered by [facility].  Working full-time and has stable housing, transportation, and access to food.  No new concerns/complaints.  Condoms and site-specific STD testing offered.  Healthcare maintenance reviewed.  Due for routine dental care.  Denies fevers, chills, night sweats, headaches, changes in vision, neck pain/stiffness, nausea, diarrhea, vomiting, lesions or rashes.  Lab Results  Component Value Date   CD4TCELL 44 08/30/2024   CD4TABS 824 08/30/2024   Lab Results  Component Value Date   HIV1RNAQUANT NOT DETECTED 06/01/2024     No Known Allergies    Outpatient Medications Prior to Visit  Medication Sig Dispense Refill   Ferrous Sulfate (IRON PO) Take 1 tablet by mouth daily.     hydrocortisone  2.5 % lotion Apply topically 2 (two) times daily. 59 mL 0   MAGNESIUM PO Take 1 tablet by mouth daily.     meloxicam (MOBIC) 15 MG tablet Take 15 mg by mouth daily.     metoCLOPramide   (REGLAN ) 10 MG tablet Take 1 tablet (10 mg total) by mouth every 6 (six) hours. 10 tablet 0   POTASSIUM PO Take 1 tablet by mouth daily. OTC     sertraline  (ZOLOFT ) 25 MG tablet Take 1 tablet (25 mg total) by mouth daily. 30 tablet 1   tiZANidine  (ZANAFLEX ) 4 MG tablet Take 1 tablet (4 mg total) by mouth every 8 (eight) hours as needed for muscle spasms. 30 tablet 0   traZODone  (DESYREL ) 50 MG tablet TAKE 0.5-1 TABLETS BY MOUTH AT BEDTIME AS NEEDED FOR SLEEP. 90 tablet 1   bictegravir-emtricitabine-tenofovir AF (BIKTARVY) 50-200-25 MG TABS tablet Take 1 tablet by mouth daily. 30 tablet 3   No facility-administered medications prior to visit.     Past Medical History:  Diagnosis Date   Anxiety    Generalized headaches    GERD (gastroesophageal reflux disease)    History of fainting spells of unknown cause 02/01/2013   HIV (human immunodeficiency virus infection) (HCC)    Seizures (HCC)    last seizure 2 weeks ago   Syncope      Past Surgical History:  Procedure Laterality Date   CIRCUMCISION  2001     Review of Systems  Constitutional:  Negative for appetite change, chills, fatigue, fever and unexpected weight change.  Eyes:  Negative for visual disturbance.  Respiratory:  Negative for cough, chest tightness, shortness of breath and wheezing.   Cardiovascular:  Negative for chest  pain and leg swelling.  Gastrointestinal:  Negative for abdominal pain, constipation, diarrhea, nausea and vomiting.  Genitourinary:  Negative for dysuria, flank pain, frequency, genital sores, hematuria and urgency.  Skin:  Negative for rash.  Allergic/Immunologic: Negative for immunocompromised state.  Neurological:  Negative for dizziness and headaches.     Objective:   BP 115/79   Pulse 74   Temp 99.1 F (37.3 C) (Oral)   Resp 16   SpO2 97%  Nursing note and vital signs reviewed.  Physical Exam Constitutional:      General: He is not in acute distress.    Appearance: He is  well-developed.  Eyes:     Conjunctiva/sclera: Conjunctivae normal.  Cardiovascular:     Rate and Rhythm: Normal rate and regular rhythm.     Heart sounds: Normal heart sounds. No murmur heard.    No friction rub. No gallop.  Pulmonary:     Effort: Pulmonary effort is normal. No respiratory distress.     Breath sounds: Normal breath sounds. No wheezing or rales.  Chest:     Chest wall: No tenderness.  Abdominal:     General: Bowel sounds are normal.     Palpations: Abdomen is soft.     Tenderness: There is no abdominal tenderness.  Musculoskeletal:     Cervical back: Neck supple.  Lymphadenopathy:     Cervical: No cervical adenopathy.  Skin:    General: Skin is warm and dry.     Findings: No rash.  Neurological:     Mental Status: He is alert and oriented to person, place, and time.          06/01/2024    2:11 PM 05/11/2024    2:11 PM  Depression screen PHQ 2/9  Decreased Interest 0 0  Down, Depressed, Hopeless 0 3  PHQ - 2 Score 0 3  Altered sleeping 0 3  Tired, decreased energy 0 2  Change in appetite 0 2  Feeling bad or failure about yourself  0 3  Trouble concentrating 0 3  Moving slowly or fidgety/restless 0 1  Suicidal thoughts 0 0  PHQ-9 Score 0  17   Difficult doing work/chores Not difficult at all      Data saved with a previous flowsheet row definition        06/01/2024    2:12 PM 05/11/2024    2:12 PM  GAD 7 : Generalized Anxiety Score  Nervous, Anxious, on Edge 0 3  Control/stop worrying 0 3  Worry too much - different things 0 3  Trouble relaxing 0 3  Restless 0 1  Easily annoyed or irritable 0 3  Afraid - awful might happen 0 3  Total GAD 7 Score 0 19  Anxiety Difficulty  Somewhat difficult     The ASCVD Risk score (Arnett DK, et al., 2019) failed to calculate for the following reasons:   The 2019 ASCVD risk score is only valid for ages 2 to 18      Assessment & Plan:    Patient Active Problem List   Diagnosis Date Noted   Healthcare  maintenance 06/01/2024   Left upper quadrant pain 05/11/2024   Internal hemorrhoid 05/11/2024   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   New onset of headaches 09/28/2023   Human immunodeficiency virus (HIV) disease (HCC) 01/23/2020   Human immunodeficiency virus (HIV) disease (HCC) 01/23/2020   Seizures (HCC) 08/03/2018   Major depressive  disorder, single episode, severe without psychotic features (HCC)    Generalized anxiety disorder    Suicidal ideation    Parent-child relationship problem    History of fainting spells of unknown cause 02/01/2013   Unspecified convulsions (HCC) 01/31/2013   Seizure (HCC) 01/31/2013   Seizure disorder (HCC) 01/30/2013   Headache 01/30/2013     Problem List Items Addressed This Visit       Other   Human immunodeficiency virus (HIV) disease (HCC)   Mr. Hirano continues to have well-controlled virus with good adherence and tolerance to Biktarvy.  Reviewed previous lab work and discussed plan of care and U equals U.  No problems obtaining medication from the pharmacy and covered by [facility].  Social determinants of health reviewed with no interventions indicated.  Check blood work.  Continue current dose of Biktarvy.  Plan for follow-up in 3 months or sooner if needed with lab work on same day.      Relevant Medications   bictegravir-emtricitabine-tenofovir AF (BIKTARVY) 50-200-25 MG TABS tablet   Other Relevant Orders   Comprehensive metabolic panel with GFR (Completed)   HIV-1 RNA quant-no reflex-bld   T-helper cell (CD4)- ([facility] clinic only) (Completed)   Healthcare maintenance   Discussed importance of safe sexual practice and condom use. Condoms and site specific STD testing offered.  Vaccinations reviewed and Prevnar 20 and Gardasil updated Due for routine dental care which he will schedule independently      Other Visit Diagnoses        Screening for STDs (sexually transmitted diseases)    -  Primary   Relevant Orders   RPR W/RFLX TO RPR TITER, TREPONEMAL AB, SCREEN AND DIAGNOSIS (Completed)   Urine cytology ancillary only (Completed)   Cytology (oral, anal, urethral) ancillary only (Completed)     Encounter for immunization       Relevant Orders   Pneumococcal conjugate vaccine 20-valent (Completed)     Need for HPV vaccination       Relevant Orders   HPV 9-valent vaccine,Recombinat (Completed)        I am having Jakeim Philley maintain his Ferrous Sulfate (IRON PO), POTASSIUM PO, MAGNESIUM PO, tiZANidine , sertraline , traZODone , metoCLOPramide , meloxicam, hydrocortisone , and bictegravir-emtricitabine-tenofovir AF.   Meds ordered this encounter  Medications   bictegravir-emtricitabine-tenofovir AF (BIKTARVY) 50-200-25 MG TABS tablet    Sig: Take 1 tablet by mouth daily.    Dispense:  30 tablet    Refill:  5    Supervising Provider:   LUIZ CHANNEL [[id number]]     Follow-up: Return in about 3 months (around 11/28/2024). or sooner if needed.    Cathlyn July, MSN, FNP-C Nurse Practitioner [facility] Main number: [phone]

## 2024-08-31 LAB — T-HELPER CELL (CD4) - (RCID CLINIC ONLY)
CD4 % Helper T Cell: 44 % (ref 33–65)
CD4 T Cell Abs: 824 /uL (ref 400–1790)

## 2024-08-31 LAB — CYTOLOGY, (ORAL, ANAL, URETHRAL) ANCILLARY ONLY
Chlamydia: NEGATIVE
Comment: NEGATIVE
Comment: NORMAL
Neisseria Gonorrhea: NEGATIVE

## 2024-08-31 LAB — URINE CYTOLOGY ANCILLARY ONLY
Chlamydia: NEGATIVE
Comment: NEGATIVE
Comment: NORMAL
Neisseria Gonorrhea: NEGATIVE

## 2024-08-31 NOTE — Assessment & Plan Note (Signed)
 Steven [name] continues to have well-controlled virus with good adherence and tolerance to Biktarvy.  Reviewed previous lab work and discussed plan of care and U equals U.  No problems obtaining medication from the pharmacy and covered by [facility].  Social determinants of health reviewed with no interventions indicated.  Check blood work.  Continue current dose of Biktarvy.  Plan for follow-up in 3 months or sooner if needed with lab work on same day.

## 2024-09-01 ENCOUNTER — Encounter: Payer: Self-pay | Admitting: Family

## 2024-09-01 LAB — COMPREHENSIVE METABOLIC PANEL WITH GFR
AG Ratio: 1.6 (calc) (ref 1.0–2.5)
ALT: 32 U/L (ref 9–46)
AST: 20 U/L (ref 10–40)
Albumin: 4.5 g/dL (ref 3.6–5.1)
Alkaline phosphatase (APISO): 67 U/L (ref 36–130)
BUN: 10 mg/dL (ref 7–25)
CO2: 25 mmol/L (ref 20–32)
Calcium: 9.5 mg/dL (ref 8.6–10.3)
Chloride: 105 mmol/L (ref 98–110)
Creat: 0.97 mg/dL (ref 0.60–1.24)
Globulin: 2.9 g/dL (ref 1.9–3.7)
Glucose, Bld: 119 mg/dL — ABNORMAL HIGH (ref 65–99)
Potassium: 4 mmol/L (ref 3.5–5.3)
Sodium: 139 mmol/L (ref 135–146)
Total Bilirubin: 0.6 mg/dL (ref 0.2–1.2)
Total Protein: 7.4 g/dL (ref 6.1–8.1)
eGFR: 112 mL/min/1.73m2 (ref >=60)

## 2024-09-01 LAB — HIV-1 RNA QUANT-NO REFLEX-BLD
HIV 1 RNA Quant: NOT DETECTED {copies}/mL
HIV-1 RNA Quant, Log: NOT DETECTED {Log_copies}/mL

## 2024-09-01 LAB — SYPHILIS: RPR W/REFLEX TO RPR TITER AND TREPONEMAL ANTIBODIES, TRADITIONAL SCREENING AND DIAGNOSIS ALGORITHM: RPR Ser Ql: NONREACTIVE

## 2024-09-01 NOTE — Assessment & Plan Note (Signed)
 Discussed importance of safe sexual practice and condom use. Condoms and site specific STD testing offered.  Vaccinations reviewed and Prevnar 20 and Gardasil updated Due for routine dental care which he will schedule independently

## 2024-12-13 ENCOUNTER — Ambulatory Visit: Admitting: Family
